# Patient Record
Sex: Female | Born: 1955 | Race: White | Hispanic: No | Smoking: Former smoker
Health system: Southern US, Community
[De-identification: ages and names within clinical notes are randomized; demographics above are authoritative.]

## PROBLEM LIST (undated history)

## (undated) ENCOUNTER — Ambulatory Visit: Source: Home / Self Care

## (undated) DIAGNOSIS — C3490 Malignant neoplasm of unspecified part of unspecified bronchus or lung: Principal | ICD-10-CM

## (undated) DIAGNOSIS — Z5189 Encounter for other specified aftercare: Secondary | ICD-10-CM

## (undated) DIAGNOSIS — F419 Anxiety disorder, unspecified: Secondary | ICD-10-CM

## (undated) DIAGNOSIS — D279 Benign neoplasm of unspecified ovary: Secondary | ICD-10-CM

## (undated) DIAGNOSIS — Z8719 Personal history of other diseases of the digestive system: Secondary | ICD-10-CM

## (undated) DIAGNOSIS — R42 Dizziness and giddiness: Secondary | ICD-10-CM

## (undated) DIAGNOSIS — J700 Acute pulmonary manifestations due to radiation: Secondary | ICD-10-CM

## (undated) DIAGNOSIS — F418 Other specified anxiety disorders: Secondary | ICD-10-CM

## (undated) DIAGNOSIS — E039 Hypothyroidism, unspecified: Secondary | ICD-10-CM

## (undated) HISTORY — DX: Hypothyroidism, unspecified: E03.9

## (undated) HISTORY — PX: ABDOMINAL HYSTERECTOMY: SHX81

## (undated) HISTORY — DX: Benign neoplasm of unspecified ovary: D27.9

## (undated) HISTORY — DX: Encounter for other specified aftercare: Z51.89

## (undated) HISTORY — DX: Other specified anxiety disorders: F41.8

## (undated) HISTORY — DX: Malignant neoplasm of unspecified part of unspecified bronchus or lung: C34.90

## (undated) HISTORY — DX: Acute pulmonary manifestations due to radiation: J70.0

## (undated) HISTORY — DX: Personal history of other diseases of the digestive system: Z87.19

## (undated) HISTORY — PX: OTHER SURGICAL HISTORY: SHX169

## (undated) HISTORY — DX: Anxiety disorder, unspecified: F41.9

---

## 1999-01-23 ENCOUNTER — Other Ambulatory Visit: Admission: RE | Admit: 1999-01-23 | Discharge: 1999-01-23 | Payer: Self-pay | Admitting: Obstetrics and Gynecology

## 1999-01-30 ENCOUNTER — Encounter: Payer: Self-pay | Admitting: Thoracic Surgery

## 1999-01-30 ENCOUNTER — Ambulatory Visit (HOSPITAL_COMMUNITY): Admission: RE | Admit: 1999-01-30 | Discharge: 1999-01-30 | Payer: Self-pay | Admitting: Thoracic Surgery

## 1999-03-31 ENCOUNTER — Encounter: Admission: RE | Admit: 1999-03-31 | Discharge: 1999-06-29 | Payer: Self-pay | Admitting: Radiation Oncology

## 1999-05-02 ENCOUNTER — Encounter: Payer: Self-pay | Admitting: Oncology

## 1999-05-02 ENCOUNTER — Encounter: Payer: Self-pay | Admitting: Emergency Medicine

## 1999-05-03 ENCOUNTER — Inpatient Hospital Stay (HOSPITAL_COMMUNITY): Admission: EM | Admit: 1999-05-03 | Discharge: 1999-05-05 | Payer: Self-pay | Admitting: Emergency Medicine

## 1999-05-05 ENCOUNTER — Encounter: Payer: Self-pay | Admitting: Oncology

## 1999-07-13 ENCOUNTER — Encounter: Payer: Self-pay | Admitting: Oncology

## 1999-07-13 ENCOUNTER — Encounter: Admission: RE | Admit: 1999-07-13 | Discharge: 1999-07-13 | Payer: Self-pay | Admitting: Oncology

## 1999-07-13 ENCOUNTER — Encounter: Admission: RE | Admit: 1999-07-13 | Discharge: 1999-10-11 | Payer: Self-pay | Admitting: Oncology

## 1999-07-13 ENCOUNTER — Emergency Department (HOSPITAL_COMMUNITY): Admission: EM | Admit: 1999-07-13 | Discharge: 1999-07-13 | Payer: Self-pay | Admitting: Emergency Medicine

## 1999-07-27 ENCOUNTER — Encounter: Admission: RE | Admit: 1999-07-27 | Discharge: 1999-07-27 | Payer: Self-pay | Admitting: Oncology

## 1999-07-27 ENCOUNTER — Encounter: Payer: Self-pay | Admitting: Oncology

## 1999-08-12 ENCOUNTER — Encounter: Payer: Self-pay | Admitting: Oncology

## 1999-08-12 ENCOUNTER — Encounter: Admission: RE | Admit: 1999-08-12 | Discharge: 1999-08-12 | Payer: Self-pay | Admitting: Oncology

## 1999-08-27 ENCOUNTER — Encounter: Payer: Self-pay | Admitting: Oncology

## 1999-08-27 ENCOUNTER — Encounter: Admission: RE | Admit: 1999-08-27 | Discharge: 1999-08-27 | Payer: Self-pay | Admitting: Oncology

## 1999-10-08 ENCOUNTER — Encounter: Payer: Self-pay | Admitting: Oncology

## 1999-10-08 ENCOUNTER — Encounter: Admission: RE | Admit: 1999-10-08 | Discharge: 1999-10-08 | Payer: Self-pay | Admitting: Oncology

## 1999-10-09 ENCOUNTER — Encounter: Payer: Self-pay | Admitting: Oncology

## 1999-10-09 ENCOUNTER — Encounter: Admission: RE | Admit: 1999-10-09 | Discharge: 1999-10-09 | Payer: Self-pay | Admitting: Oncology

## 1999-11-04 ENCOUNTER — Encounter: Admission: RE | Admit: 1999-11-04 | Discharge: 1999-11-04 | Payer: Self-pay | Admitting: Oncology

## 1999-11-04 ENCOUNTER — Encounter: Payer: Self-pay | Admitting: Oncology

## 1999-11-17 ENCOUNTER — Encounter: Admission: RE | Admit: 1999-11-17 | Discharge: 1999-11-17 | Payer: Self-pay | Admitting: Oncology

## 1999-11-17 ENCOUNTER — Encounter: Payer: Self-pay | Admitting: Oncology

## 1999-11-18 ENCOUNTER — Encounter: Admission: RE | Admit: 1999-11-18 | Discharge: 2000-02-16 | Payer: Self-pay | Admitting: Radiation Oncology

## 1999-11-25 ENCOUNTER — Encounter: Admission: RE | Admit: 1999-11-25 | Discharge: 1999-11-25 | Payer: Self-pay | Admitting: Oncology

## 1999-11-25 ENCOUNTER — Encounter: Payer: Self-pay | Admitting: Oncology

## 1999-12-09 ENCOUNTER — Ambulatory Visit (HOSPITAL_COMMUNITY): Admission: RE | Admit: 1999-12-09 | Discharge: 1999-12-09 | Payer: Self-pay | Admitting: Psychiatry

## 1999-12-22 ENCOUNTER — Ambulatory Visit (HOSPITAL_COMMUNITY): Admission: RE | Admit: 1999-12-22 | Discharge: 1999-12-22 | Payer: Self-pay | Admitting: Psychiatry

## 1999-12-28 ENCOUNTER — Ambulatory Visit (HOSPITAL_COMMUNITY): Admission: RE | Admit: 1999-12-28 | Discharge: 1999-12-28 | Payer: Self-pay | Admitting: Psychiatry

## 2000-01-04 ENCOUNTER — Ambulatory Visit (HOSPITAL_COMMUNITY): Admission: RE | Admit: 2000-01-04 | Discharge: 2000-01-04 | Payer: Self-pay | Admitting: Psychiatry

## 2000-01-18 ENCOUNTER — Ambulatory Visit (HOSPITAL_COMMUNITY): Admission: RE | Admit: 2000-01-18 | Discharge: 2000-01-18 | Payer: Self-pay | Admitting: Psychiatry

## 2000-01-25 ENCOUNTER — Encounter: Payer: Self-pay | Admitting: Oncology

## 2000-01-25 ENCOUNTER — Encounter: Admission: RE | Admit: 2000-01-25 | Discharge: 2000-01-25 | Payer: Self-pay | Admitting: Oncology

## 2000-01-27 ENCOUNTER — Encounter: Admission: RE | Admit: 2000-01-27 | Discharge: 2000-01-27 | Payer: Self-pay | Admitting: Obstetrics and Gynecology

## 2000-01-27 ENCOUNTER — Encounter: Payer: Self-pay | Admitting: Obstetrics and Gynecology

## 2000-01-28 ENCOUNTER — Ambulatory Visit (HOSPITAL_COMMUNITY): Admission: RE | Admit: 2000-01-28 | Discharge: 2000-01-28 | Payer: Self-pay | Admitting: Psychiatry

## 2000-02-10 ENCOUNTER — Other Ambulatory Visit: Admission: RE | Admit: 2000-02-10 | Discharge: 2000-02-10 | Payer: Self-pay | Admitting: Obstetrics and Gynecology

## 2000-03-31 ENCOUNTER — Encounter: Admission: RE | Admit: 2000-03-31 | Discharge: 2000-03-31 | Payer: Self-pay | Admitting: Oncology

## 2000-03-31 ENCOUNTER — Encounter: Payer: Self-pay | Admitting: Oncology

## 2000-04-11 ENCOUNTER — Ambulatory Visit (HOSPITAL_COMMUNITY): Admission: RE | Admit: 2000-04-11 | Discharge: 2000-04-11 | Payer: Self-pay | Admitting: Gastroenterology

## 2000-04-11 ENCOUNTER — Encounter (INDEPENDENT_AMBULATORY_CARE_PROVIDER_SITE_OTHER): Payer: Self-pay

## 2000-04-19 ENCOUNTER — Encounter: Payer: Self-pay | Admitting: Oncology

## 2000-04-19 ENCOUNTER — Ambulatory Visit (HOSPITAL_COMMUNITY): Admission: RE | Admit: 2000-04-19 | Discharge: 2000-04-19 | Payer: Self-pay | Admitting: Oncology

## 2000-04-19 ENCOUNTER — Encounter: Admission: RE | Admit: 2000-04-19 | Discharge: 2000-04-19 | Payer: Self-pay | Admitting: Oncology

## 2000-06-02 ENCOUNTER — Encounter: Admission: RE | Admit: 2000-06-02 | Discharge: 2000-06-02 | Payer: Self-pay | Admitting: Oncology

## 2000-06-02 ENCOUNTER — Encounter: Payer: Self-pay | Admitting: Oncology

## 2000-09-02 ENCOUNTER — Encounter: Payer: Self-pay | Admitting: Oncology

## 2000-09-02 ENCOUNTER — Encounter: Admission: RE | Admit: 2000-09-02 | Discharge: 2000-09-02 | Payer: Self-pay | Admitting: Oncology

## 2000-11-28 ENCOUNTER — Encounter: Payer: Self-pay | Admitting: Oncology

## 2000-11-28 ENCOUNTER — Encounter: Admission: RE | Admit: 2000-11-28 | Discharge: 2000-11-28 | Payer: Self-pay | Admitting: Oncology

## 2000-12-02 ENCOUNTER — Encounter: Payer: Self-pay | Admitting: Oncology

## 2000-12-02 ENCOUNTER — Ambulatory Visit (HOSPITAL_COMMUNITY): Admission: RE | Admit: 2000-12-02 | Discharge: 2000-12-02 | Payer: Self-pay | Admitting: Oncology

## 2001-01-06 ENCOUNTER — Encounter: Admission: RE | Admit: 2001-01-06 | Discharge: 2001-01-06 | Payer: Self-pay | Admitting: Oncology

## 2001-01-06 ENCOUNTER — Encounter: Payer: Self-pay | Admitting: Oncology

## 2001-01-31 ENCOUNTER — Encounter: Payer: Self-pay | Admitting: Oncology

## 2001-01-31 ENCOUNTER — Encounter: Admission: RE | Admit: 2001-01-31 | Discharge: 2001-01-31 | Payer: Self-pay | Admitting: Oncology

## 2001-02-09 ENCOUNTER — Other Ambulatory Visit: Admission: RE | Admit: 2001-02-09 | Discharge: 2001-02-09 | Payer: Self-pay | Admitting: Obstetrics and Gynecology

## 2001-04-28 ENCOUNTER — Encounter: Admission: RE | Admit: 2001-04-28 | Discharge: 2001-04-28 | Payer: Self-pay | Admitting: Oncology

## 2001-04-28 ENCOUNTER — Encounter: Payer: Self-pay | Admitting: Oncology

## 2001-06-01 ENCOUNTER — Encounter: Admission: RE | Admit: 2001-06-01 | Discharge: 2001-06-01 | Payer: Self-pay | Admitting: Oncology

## 2001-06-01 ENCOUNTER — Encounter: Payer: Self-pay | Admitting: Oncology

## 2001-08-04 ENCOUNTER — Encounter: Admission: RE | Admit: 2001-08-04 | Discharge: 2001-08-04 | Payer: Self-pay | Admitting: Oncology

## 2001-08-04 ENCOUNTER — Encounter: Payer: Self-pay | Admitting: Oncology

## 2001-10-26 ENCOUNTER — Encounter: Payer: Self-pay | Admitting: Oncology

## 2001-10-26 ENCOUNTER — Encounter: Admission: RE | Admit: 2001-10-26 | Discharge: 2001-10-26 | Payer: Self-pay | Admitting: Oncology

## 2001-10-30 ENCOUNTER — Encounter (INDEPENDENT_AMBULATORY_CARE_PROVIDER_SITE_OTHER): Payer: Self-pay | Admitting: *Deleted

## 2001-10-30 ENCOUNTER — Ambulatory Visit (HOSPITAL_COMMUNITY): Admission: RE | Admit: 2001-10-30 | Discharge: 2001-10-30 | Payer: Self-pay | Admitting: Internal Medicine

## 2001-10-30 ENCOUNTER — Encounter: Payer: Self-pay | Admitting: Internal Medicine

## 2002-02-06 ENCOUNTER — Encounter: Payer: Self-pay | Admitting: Oncology

## 2002-02-06 ENCOUNTER — Encounter: Admission: RE | Admit: 2002-02-06 | Discharge: 2002-02-06 | Payer: Self-pay | Admitting: Oncology

## 2002-02-16 ENCOUNTER — Other Ambulatory Visit: Admission: RE | Admit: 2002-02-16 | Discharge: 2002-02-16 | Payer: Self-pay | Admitting: Obstetrics and Gynecology

## 2002-03-03 ENCOUNTER — Encounter: Payer: Self-pay | Admitting: Emergency Medicine

## 2002-03-03 ENCOUNTER — Emergency Department (HOSPITAL_COMMUNITY): Admission: EM | Admit: 2002-03-03 | Discharge: 2002-03-03 | Payer: Self-pay | Admitting: Emergency Medicine

## 2002-05-03 ENCOUNTER — Encounter: Admission: RE | Admit: 2002-05-03 | Discharge: 2002-05-03 | Payer: Self-pay | Admitting: Oncology

## 2002-05-03 ENCOUNTER — Encounter: Payer: Self-pay | Admitting: Oncology

## 2002-08-06 ENCOUNTER — Encounter: Admission: RE | Admit: 2002-08-06 | Discharge: 2002-08-06 | Payer: Self-pay | Admitting: Oncology

## 2002-08-06 ENCOUNTER — Encounter: Payer: Self-pay | Admitting: Oncology

## 2002-10-22 ENCOUNTER — Encounter: Admission: RE | Admit: 2002-10-22 | Discharge: 2002-10-22 | Payer: Self-pay | Admitting: Oncology

## 2002-10-22 ENCOUNTER — Encounter: Payer: Self-pay | Admitting: Oncology

## 2002-12-14 ENCOUNTER — Encounter: Admission: RE | Admit: 2002-12-14 | Discharge: 2002-12-14 | Payer: Self-pay | Admitting: Family Medicine

## 2002-12-14 ENCOUNTER — Encounter: Payer: Self-pay | Admitting: Family Medicine

## 2003-01-18 ENCOUNTER — Other Ambulatory Visit: Admission: RE | Admit: 2003-01-18 | Discharge: 2003-01-18 | Payer: Self-pay | Admitting: *Deleted

## 2003-02-12 ENCOUNTER — Encounter: Payer: Self-pay | Admitting: Oncology

## 2003-02-12 ENCOUNTER — Encounter: Admission: RE | Admit: 2003-02-12 | Discharge: 2003-02-12 | Payer: Self-pay | Admitting: Oncology

## 2003-03-11 ENCOUNTER — Other Ambulatory Visit: Admission: RE | Admit: 2003-03-11 | Discharge: 2003-03-11 | Payer: Self-pay | Admitting: Obstetrics and Gynecology

## 2003-04-24 ENCOUNTER — Encounter (INDEPENDENT_AMBULATORY_CARE_PROVIDER_SITE_OTHER): Payer: Self-pay | Admitting: *Deleted

## 2003-04-24 ENCOUNTER — Encounter: Payer: Self-pay | Admitting: Obstetrics and Gynecology

## 2003-04-24 ENCOUNTER — Ambulatory Visit (HOSPITAL_COMMUNITY): Admission: RE | Admit: 2003-04-24 | Discharge: 2003-04-24 | Payer: Self-pay | Admitting: Obstetrics and Gynecology

## 2003-05-22 ENCOUNTER — Encounter: Admission: RE | Admit: 2003-05-22 | Discharge: 2003-05-22 | Payer: Self-pay | Admitting: Oncology

## 2003-05-22 ENCOUNTER — Encounter: Payer: Self-pay | Admitting: Oncology

## 2003-08-29 ENCOUNTER — Encounter: Admission: RE | Admit: 2003-08-29 | Discharge: 2003-08-29 | Payer: Self-pay | Admitting: Oncology

## 2003-10-08 ENCOUNTER — Ambulatory Visit (HOSPITAL_COMMUNITY): Admission: RE | Admit: 2003-10-08 | Discharge: 2003-10-08 | Payer: Self-pay | Admitting: Oncology

## 2003-10-31 ENCOUNTER — Encounter: Admission: RE | Admit: 2003-10-31 | Discharge: 2003-10-31 | Payer: Self-pay | Admitting: Oncology

## 2003-11-14 ENCOUNTER — Other Ambulatory Visit (HOSPITAL_COMMUNITY): Admission: RE | Admit: 2003-11-14 | Discharge: 2003-11-29 | Payer: Self-pay | Admitting: Psychiatry

## 2003-12-31 ENCOUNTER — Other Ambulatory Visit: Admission: RE | Admit: 2003-12-31 | Discharge: 2003-12-31 | Payer: Self-pay | Admitting: Obstetrics and Gynecology

## 2004-01-09 ENCOUNTER — Encounter: Admission: RE | Admit: 2004-01-09 | Discharge: 2004-01-09 | Payer: Self-pay | Admitting: Obstetrics and Gynecology

## 2004-02-25 ENCOUNTER — Encounter: Admission: RE | Admit: 2004-02-25 | Discharge: 2004-02-25 | Payer: Self-pay | Admitting: Oncology

## 2004-06-23 ENCOUNTER — Ambulatory Visit (HOSPITAL_COMMUNITY): Admission: RE | Admit: 2004-06-23 | Discharge: 2004-06-23 | Payer: Self-pay | Admitting: Oncology

## 2004-10-19 ENCOUNTER — Ambulatory Visit: Payer: Self-pay | Admitting: Oncology

## 2004-10-21 ENCOUNTER — Encounter: Admission: RE | Admit: 2004-10-21 | Discharge: 2004-10-21 | Payer: Self-pay | Admitting: Oncology

## 2005-02-23 ENCOUNTER — Encounter: Admission: RE | Admit: 2005-02-23 | Discharge: 2005-02-23 | Payer: Self-pay | Admitting: Obstetrics and Gynecology

## 2005-04-19 ENCOUNTER — Ambulatory Visit: Payer: Self-pay | Admitting: Oncology

## 2005-04-22 ENCOUNTER — Encounter: Admission: RE | Admit: 2005-04-22 | Discharge: 2005-04-22 | Payer: Self-pay | Admitting: Oncology

## 2005-06-01 ENCOUNTER — Ambulatory Visit: Admission: RE | Admit: 2005-06-01 | Discharge: 2005-06-01 | Payer: Self-pay | Admitting: Gynecologic Oncology

## 2005-07-02 ENCOUNTER — Ambulatory Visit: Payer: Self-pay | Admitting: Oncology

## 2005-07-06 ENCOUNTER — Ambulatory Visit (HOSPITAL_COMMUNITY): Admission: RE | Admit: 2005-07-06 | Discharge: 2005-07-06 | Payer: Self-pay | Admitting: Gynecologic Oncology

## 2005-07-06 ENCOUNTER — Encounter (INDEPENDENT_AMBULATORY_CARE_PROVIDER_SITE_OTHER): Payer: Self-pay | Admitting: Specialist

## 2005-08-17 ENCOUNTER — Encounter: Admission: RE | Admit: 2005-08-17 | Discharge: 2005-11-15 | Payer: Self-pay | Admitting: Psychiatry

## 2005-08-17 ENCOUNTER — Ambulatory Visit: Payer: Self-pay | Admitting: Psychology

## 2005-10-06 ENCOUNTER — Ambulatory Visit: Admission: RE | Admit: 2005-10-06 | Discharge: 2005-10-06 | Payer: Self-pay | Admitting: Gynecologic Oncology

## 2005-10-21 ENCOUNTER — Ambulatory Visit: Payer: Self-pay | Admitting: Oncology

## 2005-12-10 ENCOUNTER — Ambulatory Visit: Payer: Self-pay | Admitting: Oncology

## 2005-12-13 ENCOUNTER — Ambulatory Visit (HOSPITAL_COMMUNITY): Admission: RE | Admit: 2005-12-13 | Discharge: 2005-12-13 | Payer: Self-pay | Admitting: Oncology

## 2005-12-17 ENCOUNTER — Ambulatory Visit: Payer: Self-pay | Admitting: Oncology

## 2006-06-08 ENCOUNTER — Ambulatory Visit: Payer: Self-pay | Admitting: Oncology

## 2006-06-10 ENCOUNTER — Ambulatory Visit (HOSPITAL_COMMUNITY): Admission: RE | Admit: 2006-06-10 | Discharge: 2006-06-10 | Payer: Self-pay | Admitting: Oncology

## 2006-06-10 LAB — CBC WITH DIFFERENTIAL/PLATELET
BASO%: 0.1 % (ref 0.0–2.0)
EOS%: 2 % (ref 0.0–7.0)
Eosinophils Absolute: 0.1 10*3/uL (ref 0.0–0.5)
LYMPH%: 18.1 % (ref 14.0–48.0)
MCH: 31.3 pg (ref 26.0–34.0)
MCHC: 34.4 g/dL (ref 32.0–36.0)
MCV: 90.9 fL (ref 81.0–101.0)
MONO%: 6.5 % (ref 0.0–13.0)
NEUT#: 3.8 10*3/uL (ref 1.5–6.5)
RBC: 4.11 10*6/uL (ref 3.70–5.32)
RDW: 13.5 % (ref 11.3–14.5)

## 2006-06-10 LAB — COMPREHENSIVE METABOLIC PANEL
ALT: 12 U/L (ref 0–40)
AST: 13 U/L (ref 0–37)
Albumin: 4.7 g/dL (ref 3.5–5.2)
Alkaline Phosphatase: 61 U/L (ref 39–117)
Potassium: 4.4 mEq/L (ref 3.5–5.3)
Sodium: 137 mEq/L (ref 135–145)
Total Bilirubin: 0.4 mg/dL (ref 0.3–1.2)
Total Protein: 7.6 g/dL (ref 6.0–8.3)

## 2006-09-06 ENCOUNTER — Ambulatory Visit: Payer: Self-pay | Admitting: Psychiatry

## 2006-09-15 ENCOUNTER — Ambulatory Visit: Payer: Self-pay | Admitting: Psychiatry

## 2006-09-26 ENCOUNTER — Emergency Department (HOSPITAL_COMMUNITY): Admission: EM | Admit: 2006-09-26 | Discharge: 2006-09-26 | Payer: Self-pay | Admitting: Emergency Medicine

## 2006-10-12 ENCOUNTER — Ambulatory Visit: Payer: Self-pay | Admitting: Psychiatry

## 2006-10-19 ENCOUNTER — Ambulatory Visit: Payer: Self-pay | Admitting: Psychiatry

## 2006-12-06 ENCOUNTER — Ambulatory Visit: Payer: Self-pay | Admitting: Oncology

## 2006-12-06 ENCOUNTER — Ambulatory Visit (HOSPITAL_COMMUNITY): Admission: RE | Admit: 2006-12-06 | Discharge: 2006-12-06 | Payer: Self-pay | Admitting: Oncology

## 2006-12-07 LAB — COMPREHENSIVE METABOLIC PANEL
Albumin: 4.4 g/dL (ref 3.5–5.2)
Alkaline Phosphatase: 66 U/L (ref 39–117)
BUN: 16 mg/dL (ref 6–23)
CO2: 26 mEq/L (ref 19–32)
Glucose, Bld: 103 mg/dL — ABNORMAL HIGH (ref 70–99)
Potassium: 4.5 mEq/L (ref 3.5–5.3)
Total Bilirubin: 0.3 mg/dL (ref 0.3–1.2)
Total Protein: 7.7 g/dL (ref 6.0–8.3)

## 2006-12-07 LAB — CBC WITH DIFFERENTIAL/PLATELET
Basophils Absolute: 0 10*3/uL (ref 0.0–0.1)
Eosinophils Absolute: 0.2 10*3/uL (ref 0.0–0.5)
HGB: 13 g/dL (ref 11.6–15.9)
LYMPH%: 18.9 % (ref 14.0–48.0)
MCV: 88.2 fL (ref 81.0–101.0)
MONO#: 0.4 10*3/uL (ref 0.1–0.9)
MONO%: 7.8 % (ref 0.0–13.0)
NEUT#: 3.4 10*3/uL (ref 1.5–6.5)
Platelets: 374 10*3/uL (ref 145–400)
WBC: 5 10*3/uL (ref 3.9–10.0)

## 2006-12-07 LAB — LACTATE DEHYDROGENASE: LDH: 120 U/L (ref 94–250)

## 2006-12-09 ENCOUNTER — Ambulatory Visit (HOSPITAL_COMMUNITY): Admission: RE | Admit: 2006-12-09 | Discharge: 2006-12-09 | Payer: Self-pay | Admitting: Oncology

## 2007-01-31 ENCOUNTER — Ambulatory Visit: Payer: Self-pay | Admitting: Oncology

## 2007-02-02 LAB — TSH: TSH: 1.831 u[IU]/mL (ref 0.350–5.500)

## 2007-05-29 ENCOUNTER — Ambulatory Visit: Payer: Self-pay | Admitting: Oncology

## 2007-05-31 ENCOUNTER — Ambulatory Visit (HOSPITAL_COMMUNITY): Admission: RE | Admit: 2007-05-31 | Discharge: 2007-05-31 | Payer: Self-pay | Admitting: Oncology

## 2007-05-31 LAB — CBC WITH DIFFERENTIAL/PLATELET
Basophils Absolute: 0.1 10*3/uL (ref 0.0–0.1)
EOS%: 3 % (ref 0.0–7.0)
HGB: 12.5 g/dL (ref 11.6–15.9)
MCH: 30.8 pg (ref 26.0–34.0)
MCV: 88.7 fL (ref 81.0–101.0)
MONO%: 8.6 % (ref 0.0–13.0)
NEUT%: 66.1 % (ref 39.6–76.8)
RDW: 14.9 % — ABNORMAL HIGH (ref 11.3–14.5)

## 2007-05-31 LAB — COMPREHENSIVE METABOLIC PANEL
AST: 25 U/L (ref 0–37)
Alkaline Phosphatase: 64 U/L (ref 39–117)
BUN: 12 mg/dL (ref 6–23)
Creatinine, Ser: 0.69 mg/dL (ref 0.40–1.20)
Potassium: 4.8 mEq/L (ref 3.5–5.3)

## 2007-09-01 ENCOUNTER — Emergency Department (HOSPITAL_COMMUNITY): Admission: EM | Admit: 2007-09-01 | Discharge: 2007-09-02 | Payer: Self-pay | Admitting: Emergency Medicine

## 2007-11-23 ENCOUNTER — Ambulatory Visit: Payer: Self-pay | Admitting: Oncology

## 2007-11-27 ENCOUNTER — Ambulatory Visit (HOSPITAL_COMMUNITY): Admission: RE | Admit: 2007-11-27 | Discharge: 2007-11-27 | Payer: Self-pay | Admitting: Oncology

## 2007-11-27 LAB — COMPREHENSIVE METABOLIC PANEL
AST: 18 U/L (ref 0–37)
Albumin: 4 g/dL (ref 3.5–5.2)
BUN: 12 mg/dL (ref 6–23)
Calcium: 9.2 mg/dL (ref 8.4–10.5)
Chloride: 102 mEq/L (ref 96–112)
Potassium: 3.5 mEq/L (ref 3.5–5.3)
Sodium: 138 mEq/L (ref 135–145)
Total Protein: 7.3 g/dL (ref 6.0–8.3)

## 2007-11-27 LAB — CBC WITH DIFFERENTIAL/PLATELET
Basophils Absolute: 0 10*3/uL (ref 0.0–0.1)
EOS%: 4.8 % (ref 0.0–7.0)
Eosinophils Absolute: 0.3 10*3/uL (ref 0.0–0.5)
HGB: 12.8 g/dL (ref 11.6–15.9)
NEUT#: 4 10*3/uL (ref 1.5–6.5)
RDW: 14 % (ref 11.3–14.5)
lymph#: 1.6 10*3/uL (ref 0.9–3.3)

## 2008-01-27 IMAGING — CT CT CHEST W/ CM
2 of 4 series · 17 of 46 positions shown, 19 images · non-contrast
Comparison: none

CLINICAL DATA: Lung carcinoma status post chemotherapy and radiation.

[Series 2: cap 5.0 b40f · axial · 0.71mm/px · z∈[-664,-44]mm · 14 of 136 slices shown, 16 images]
[im 6/136  soft-tissue]
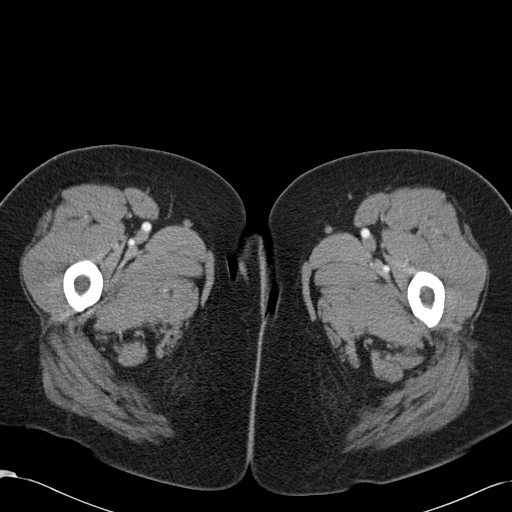
[im 6/136  bone]
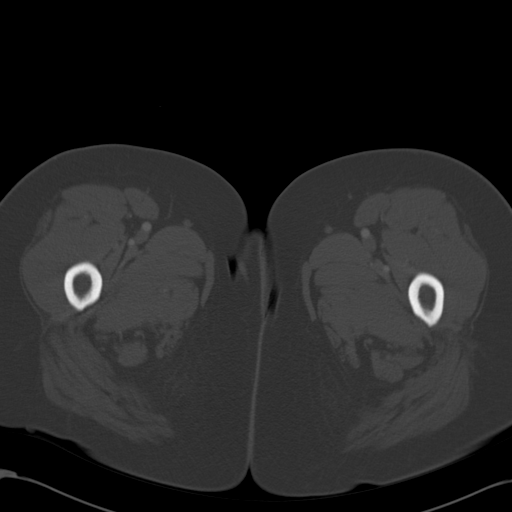
[im 17/136  soft-tissue]
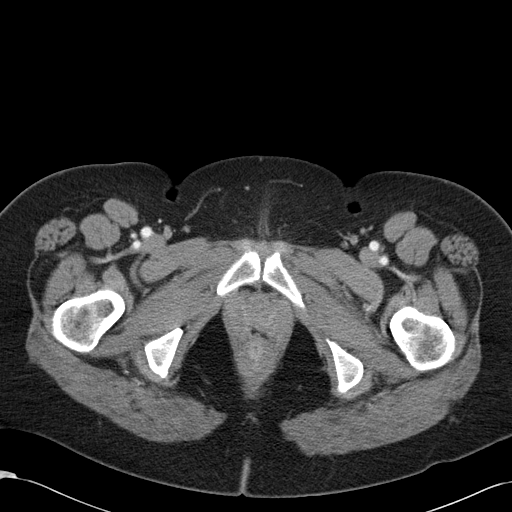
[im 29/136  soft-tissue]
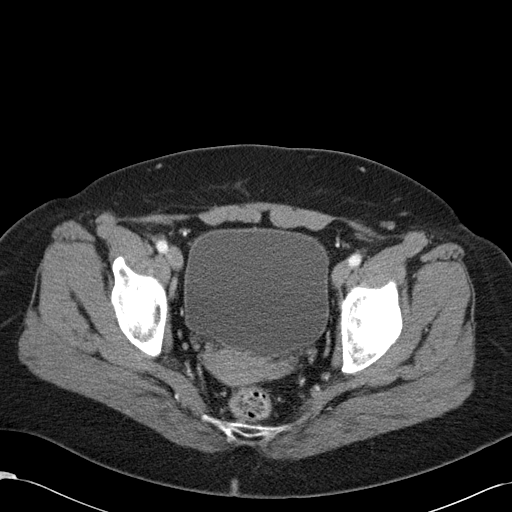
[im 34/136  soft-tissue]
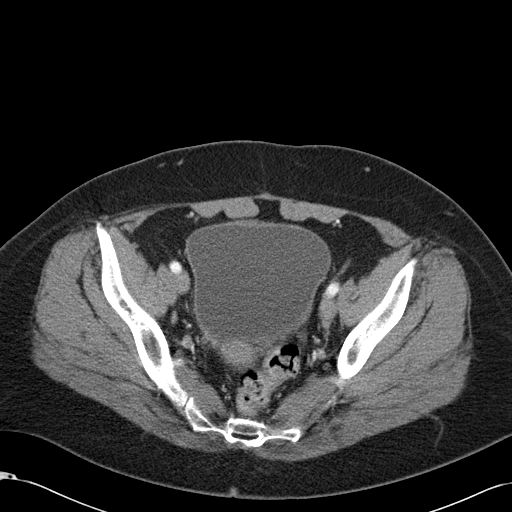
[im 46/136  soft-tissue]
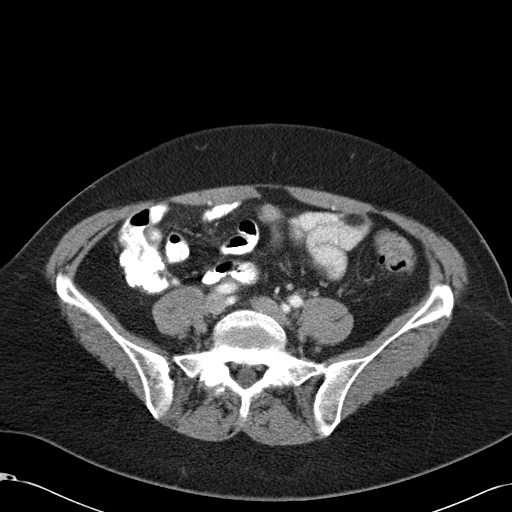
[im 57/136  soft-tissue]
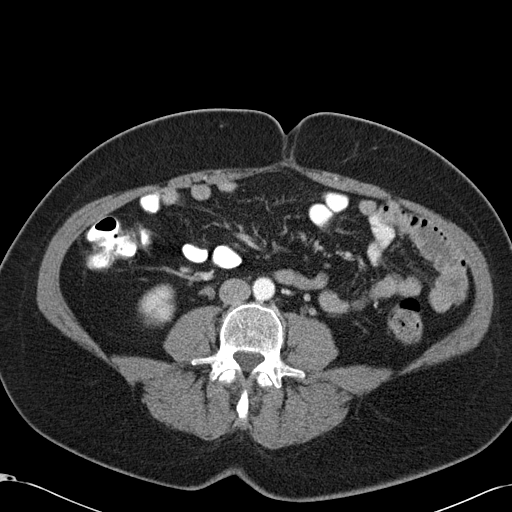
[im 62/136  soft-tissue]
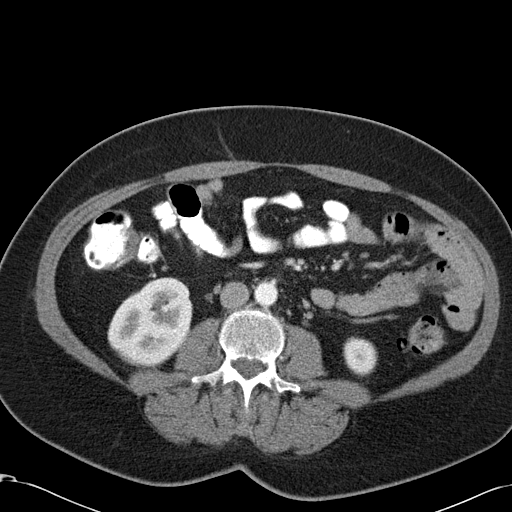
[im 74/136  soft-tissue]
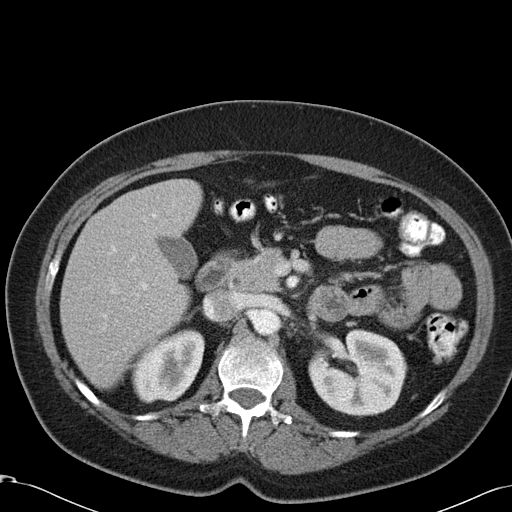
[im 79/136  soft-tissue]
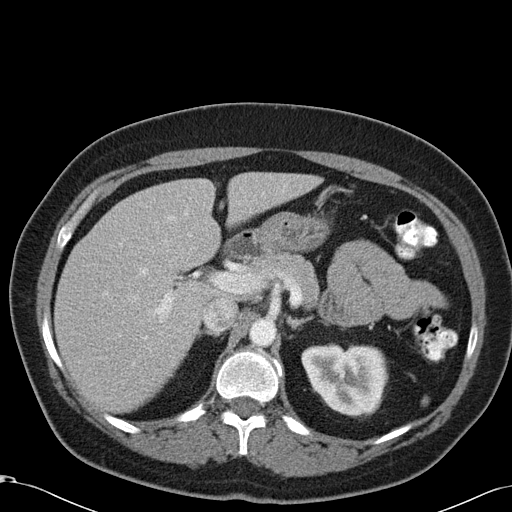
[im 79/136  bone]
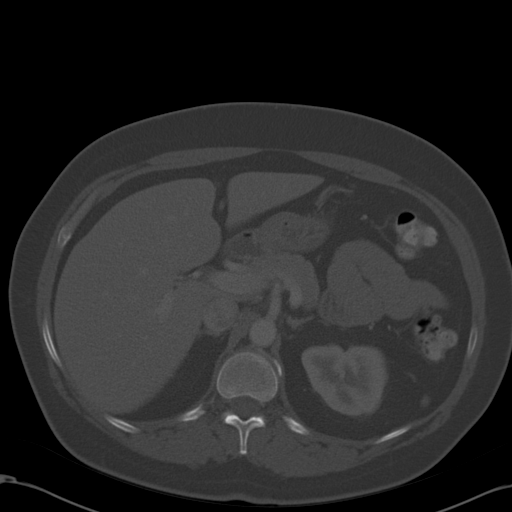
[im 91/136  soft-tissue]
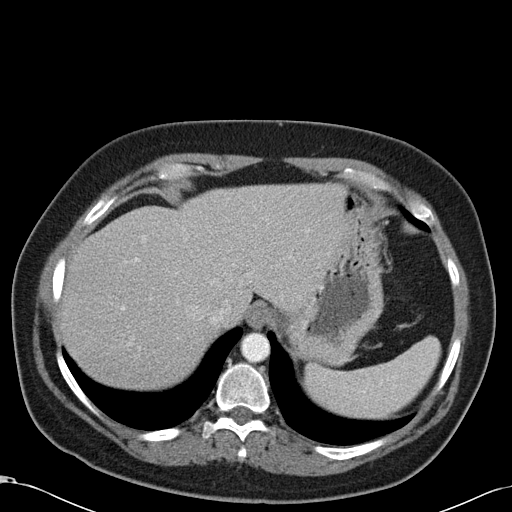
[im 102/136  soft-tissue]
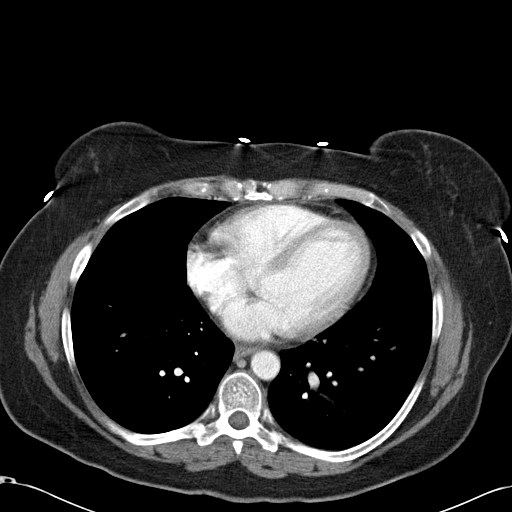
[im 107/136  soft-tissue]
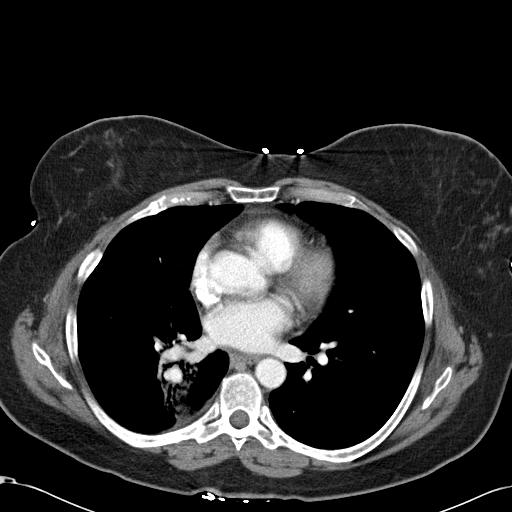
[im 119/136  soft-tissue]
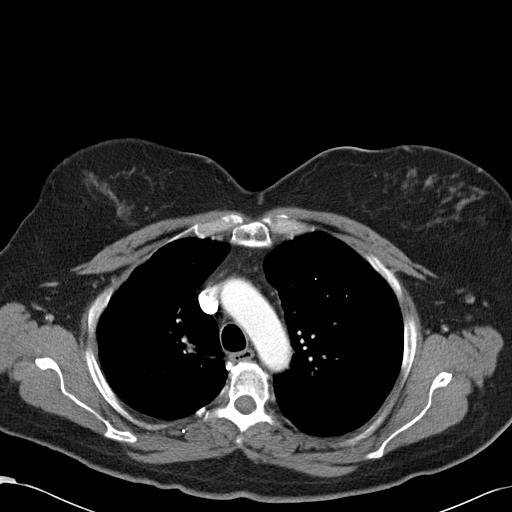
[im 130/136  soft-tissue]
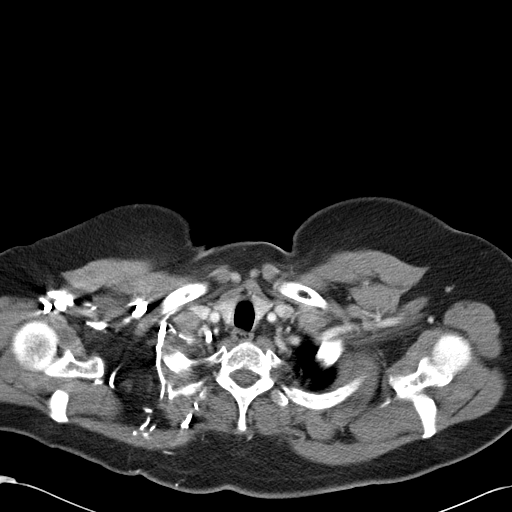

[Series 602: <mpr thick range> · coronal · 1.32mm/px · 3 of 83 slices shown]
[im 28/83  soft-tissue]
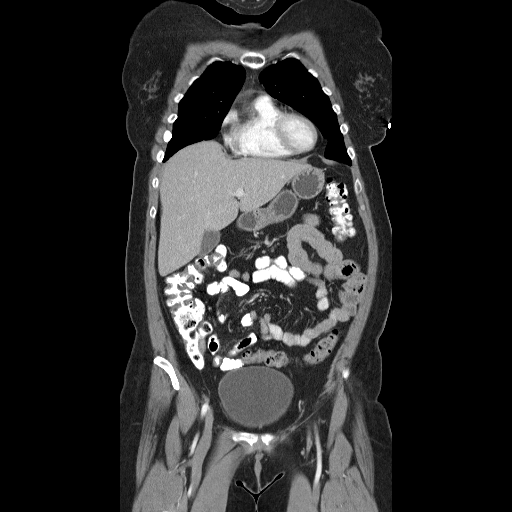
[im 37/83  soft-tissue]
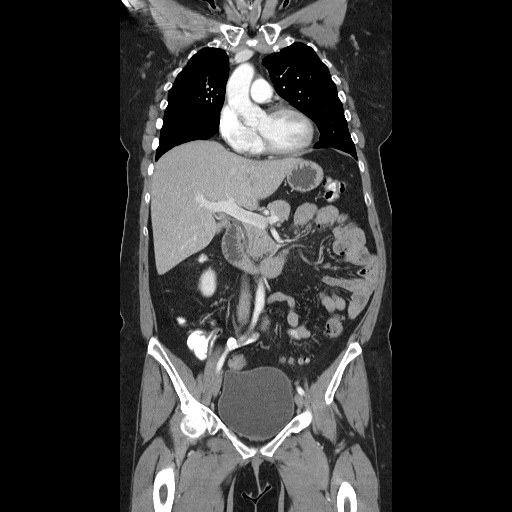
[im 46/83  soft-tissue]
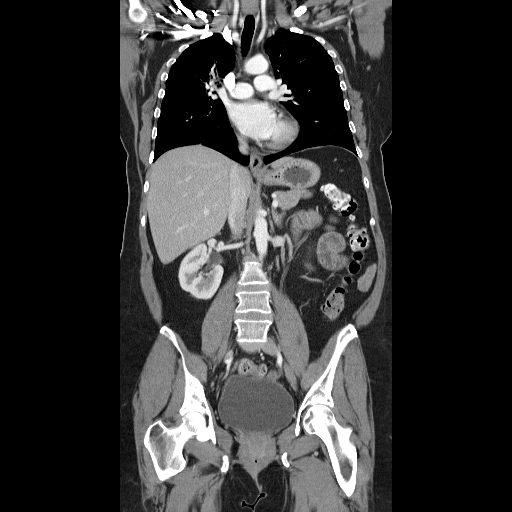

[17 of 46 positions shown; findings below may reference images not displayed]

CT chest with contrast:

Multidetector helical CT after 125  ml 4mnipaque-COO IV.
Comparison 06/10/2006. Stable right perihilar   parenchymal fibrosis and traction
bronchiectasis presumably related to previous radiation therapy. No hilar or
mediastinal adenopathy. Coronary calcifications are evident. No pleural or
pericardial effusion. Left lung remains clear. Vascular clip in right  
supraclavicular region. Healing fractures of the anterolateral aspect right
fifth through seventh ribs. No other bone lesions evident.
IMPRESSION: 1. Stable   right perihilar parenchymal changes without new mass, nodule, or
adenopathy.
2. Coronary calcifications.

CT abdomen with contrast:

Unremarkable adrenal glands, liver, gallbladder, spleen and accessory splenule,
kidneys, pancreas, nondilated aorta. The cystic right renal lesion is nearly
completely resolved. Small bowel decompressed. Portal vein patent. No free air.
No ascites. Stable L1 compression deformity.
IMPRESSION: 1. Negative for mass or adenopathy.
2. Stable L1 compression deformity.

CT pelvis with contrast:

Normal appendix. The colon is nondilated with a few scattered diverticula from
descending and sigmoid portions. Urinary bladder physiologically distended. No
free fluid. No adenopathy.
IMPRESSION: 1. Negative for mass or adenopathy.
2. Descending and sigmoid diverticula.

## 2008-04-01 ENCOUNTER — Ambulatory Visit: Payer: Self-pay | Admitting: Internal Medicine

## 2008-04-01 ENCOUNTER — Encounter (INDEPENDENT_AMBULATORY_CARE_PROVIDER_SITE_OTHER): Payer: Self-pay | Admitting: Family Medicine

## 2008-04-01 LAB — CONVERTED CEMR LAB: TSH: 3.045 microintl units/mL (ref 0.350–4.50)

## 2008-04-25 ENCOUNTER — Ambulatory Visit: Payer: Self-pay | Admitting: Family Medicine

## 2008-05-07 ENCOUNTER — Ambulatory Visit (HOSPITAL_COMMUNITY): Admission: RE | Admit: 2008-05-07 | Discharge: 2008-05-07 | Payer: Self-pay | Admitting: Family Medicine

## 2008-05-08 ENCOUNTER — Ambulatory Visit (HOSPITAL_COMMUNITY): Admission: RE | Admit: 2008-05-08 | Discharge: 2008-05-08 | Payer: Self-pay | Admitting: Family Medicine

## 2008-05-14 ENCOUNTER — Ambulatory Visit: Payer: Self-pay | Admitting: Family Medicine

## 2008-05-14 LAB — CONVERTED CEMR LAB
Albumin: 4.5 g/dL (ref 3.5–5.2)
Alkaline Phosphatase: 60 units/L (ref 39–117)
BUN: 13 mg/dL (ref 6–23)
Calcium: 10 mg/dL (ref 8.4–10.5)
Chloride: 104 meq/L (ref 96–112)
Eosinophils Absolute: 0.3 10*3/uL (ref 0.0–0.7)
Glucose, Bld: 94 mg/dL (ref 70–99)
HDL: 65 mg/dL (ref >39)
Hemoglobin: 12.6 g/dL (ref 12.0–15.0)
Lymphs Abs: 1.3 10*3/uL (ref 0.7–4.0)
MCV: 93.6 fL (ref 78.0–100.0)
Monocytes Absolute: 0.4 10*3/uL (ref 0.1–1.0)
Monocytes Relative: 6 % (ref 3–12)
Neutrophils Relative %: 67 % (ref 43–77)
Potassium: 4.3 meq/L (ref 3.5–5.3)
RBC: 4.25 M/uL (ref 3.87–5.11)
Triglycerides: 169 mg/dL — ABNORMAL HIGH (ref <150)
WBC: 6.1 10*3/uL (ref 4.0–10.5)

## 2008-06-07 ENCOUNTER — Ambulatory Visit: Payer: Self-pay | Admitting: Oncology

## 2008-06-11 ENCOUNTER — Ambulatory Visit (HOSPITAL_COMMUNITY): Admission: RE | Admit: 2008-06-11 | Discharge: 2008-06-11 | Payer: Self-pay | Admitting: Oncology

## 2008-06-11 LAB — CBC WITH DIFFERENTIAL/PLATELET
BASO%: 0.3 % (ref 0.0–2.0)
Basophils Absolute: 0 10*3/uL (ref 0.0–0.1)
Eosinophils Absolute: 0.3 10*3/uL (ref 0.0–0.5)
HCT: 37.4 % (ref 34.8–46.6)
HGB: 12.8 g/dL (ref 11.6–15.9)
LYMPH%: 22.8 % (ref 14.0–48.0)
MCHC: 34.2 g/dL (ref 32.0–36.0)
MONO#: 0.4 10*3/uL (ref 0.1–0.9)
NEUT%: 61.6 % (ref 39.6–76.8)
Platelets: 318 10*3/uL (ref 145–400)
WBC: 4.2 10*3/uL (ref 3.9–10.0)
lymph#: 1 10*3/uL (ref 0.9–3.3)

## 2008-06-11 LAB — COMPREHENSIVE METABOLIC PANEL
ALT: 32 U/L (ref 0–35)
BUN: 14 mg/dL (ref 6–23)
CO2: 24 mEq/L (ref 19–32)
Calcium: 9.2 mg/dL (ref 8.4–10.5)
Chloride: 101 mEq/L (ref 96–112)
Creatinine, Ser: 0.67 mg/dL (ref 0.40–1.20)
Glucose, Bld: 98 mg/dL (ref 70–99)
Total Bilirubin: 0.2 mg/dL — ABNORMAL LOW (ref 0.3–1.2)

## 2008-06-11 LAB — LACTATE DEHYDROGENASE: LDH: 159 U/L (ref 94–250)

## 2008-06-20 ENCOUNTER — Ambulatory Visit (HOSPITAL_COMMUNITY): Admission: RE | Admit: 2008-06-20 | Discharge: 2008-06-20 | Payer: Self-pay | Admitting: Gastroenterology

## 2008-09-12 ENCOUNTER — Emergency Department (HOSPITAL_BASED_OUTPATIENT_CLINIC_OR_DEPARTMENT_OTHER): Admission: EM | Admit: 2008-09-12 | Discharge: 2008-09-12 | Payer: Self-pay | Admitting: Emergency Medicine

## 2008-09-23 ENCOUNTER — Ambulatory Visit: Payer: Self-pay | Admitting: Family Medicine

## 2008-09-23 LAB — CONVERTED CEMR LAB: TSH: 2.027 u[IU]/mL

## 2008-10-11 ENCOUNTER — Ambulatory Visit: Payer: Self-pay | Admitting: Family Medicine

## 2008-10-17 ENCOUNTER — Ambulatory Visit: Payer: Self-pay | Admitting: *Deleted

## 2008-10-17 ENCOUNTER — Ambulatory Visit (HOSPITAL_COMMUNITY): Admission: RE | Admit: 2008-10-17 | Discharge: 2008-10-17 | Payer: Self-pay | Admitting: Family Medicine

## 2008-12-05 ENCOUNTER — Encounter (INDEPENDENT_AMBULATORY_CARE_PROVIDER_SITE_OTHER): Payer: Self-pay | Admitting: Family Medicine

## 2008-12-05 ENCOUNTER — Ambulatory Visit: Payer: Self-pay | Admitting: Family Medicine

## 2008-12-05 ENCOUNTER — Ambulatory Visit: Payer: Self-pay | Admitting: Oncology

## 2008-12-09 ENCOUNTER — Ambulatory Visit (HOSPITAL_COMMUNITY): Admission: RE | Admit: 2008-12-09 | Discharge: 2008-12-09 | Payer: Self-pay | Admitting: Oncology

## 2008-12-09 ENCOUNTER — Ambulatory Visit (HOSPITAL_COMMUNITY): Admission: RE | Admit: 2008-12-09 | Discharge: 2008-12-09 | Payer: Self-pay | Admitting: Family Medicine

## 2008-12-09 LAB — COMPREHENSIVE METABOLIC PANEL
Alkaline Phosphatase: 64 U/L (ref 39–117)
CO2: 27 mEq/L (ref 19–32)
Creatinine, Ser: 0.6 mg/dL (ref 0.40–1.20)
Glucose, Bld: 92 mg/dL (ref 70–99)
Sodium: 136 mEq/L (ref 135–145)
Total Bilirubin: 0.5 mg/dL (ref 0.3–1.2)
Total Protein: 8 g/dL (ref 6.0–8.3)

## 2008-12-09 LAB — CBC WITH DIFFERENTIAL/PLATELET
BASO%: 0.2 % (ref 0.0–2.0)
EOS%: 3.1 % (ref 0.0–7.0)
LYMPH%: 21.9 % (ref 14.0–49.7)
MCH: 30.4 pg (ref 25.1–34.0)
MCHC: 34.1 g/dL (ref 31.5–36.0)
MONO#: 0.5 10*3/uL (ref 0.1–0.9)
RBC: 4.26 10*6/uL (ref 3.70–5.45)
WBC: 7.1 10*3/uL (ref 3.9–10.3)
lymph#: 1.6 10*3/uL (ref 0.9–3.3)

## 2008-12-09 LAB — MORPHOLOGY
PLT EST: ADEQUATE
RBC Comments: NORMAL

## 2008-12-09 LAB — LACTATE DEHYDROGENASE: LDH: 116 U/L (ref 94–250)

## 2009-02-18 ENCOUNTER — Ambulatory Visit: Payer: Self-pay | Admitting: Internal Medicine

## 2009-09-16 ENCOUNTER — Ambulatory Visit: Payer: Self-pay | Admitting: Internal Medicine

## 2009-09-18 ENCOUNTER — Ambulatory Visit: Payer: Self-pay | Admitting: Internal Medicine

## 2009-11-04 ENCOUNTER — Ambulatory Visit: Payer: Self-pay | Admitting: Family Medicine

## 2009-11-04 LAB — CONVERTED CEMR LAB
Albumin: 4.5 g/dL (ref 3.5–5.2)
BUN: 19 mg/dL (ref 6–23)
CO2: 22 meq/L (ref 19–32)
Calcium: 9.5 mg/dL (ref 8.4–10.5)
Chloride: 101 meq/L (ref 96–112)
Cholesterol: 171 mg/dL (ref 0–200)
Glucose, Bld: 91 mg/dL (ref 70–99)
HDL: 48 mg/dL (ref >39)
Potassium: 4.1 meq/L (ref 3.5–5.3)
Sodium: 139 meq/L (ref 135–145)
Triglycerides: 142 mg/dL (ref <150)

## 2009-12-12 ENCOUNTER — Ambulatory Visit: Payer: Self-pay | Admitting: Oncology

## 2009-12-16 ENCOUNTER — Ambulatory Visit (HOSPITAL_COMMUNITY): Admission: RE | Admit: 2009-12-16 | Discharge: 2009-12-16 | Payer: Self-pay | Admitting: Oncology

## 2009-12-16 LAB — CBC WITH DIFFERENTIAL/PLATELET
Basophils Absolute: 0 10*3/uL (ref 0.0–0.1)
Eosinophils Absolute: 0.4 10*3/uL (ref 0.0–0.5)
HGB: 12.9 g/dL (ref 11.6–15.9)
LYMPH%: 21.3 % (ref 14.0–49.7)
MCV: 89.2 fL (ref 79.5–101.0)
MONO%: 7.1 % (ref 0.0–14.0)
NEUT#: 5.5 10*3/uL (ref 1.5–6.5)
Platelets: 360 10*3/uL (ref 145–400)
RDW: 13.7 % (ref 11.2–14.5)

## 2009-12-16 LAB — LACTATE DEHYDROGENASE: LDH: 127 U/L (ref 94–250)

## 2009-12-16 LAB — COMPREHENSIVE METABOLIC PANEL
Albumin: 4.2 g/dL (ref 3.5–5.2)
Alkaline Phosphatase: 65 U/L (ref 39–117)
BUN: 23 mg/dL (ref 6–23)
Calcium: 9.8 mg/dL (ref 8.4–10.5)
Glucose, Bld: 76 mg/dL (ref 70–99)
Potassium: 3.7 mEq/L (ref 3.5–5.3)

## 2009-12-22 ENCOUNTER — Encounter: Payer: Self-pay | Admitting: Internal Medicine

## 2010-07-14 ENCOUNTER — Ambulatory Visit (HOSPITAL_COMMUNITY): Admission: RE | Admit: 2010-07-14 | Discharge: 2010-07-14 | Payer: Self-pay | Admitting: Internal Medicine

## 2010-07-29 ENCOUNTER — Encounter: Admission: RE | Admit: 2010-07-29 | Discharge: 2010-07-29 | Payer: Self-pay | Admitting: Internal Medicine

## 2010-10-11 ENCOUNTER — Encounter: Payer: Self-pay | Admitting: Oncology

## 2010-10-12 ENCOUNTER — Encounter: Payer: Self-pay | Admitting: Family Medicine

## 2010-10-22 NOTE — Letter (Signed)
[facility]   Imported By: Lennie Odor 01/07/2010 13:51:53  _____________________________________________________________________  External Attachment:    Type:   Image     Comment:   External Document

## 2010-12-15 ENCOUNTER — Other Ambulatory Visit (HOSPITAL_COMMUNITY): Payer: Self-pay | Admitting: Family Medicine

## 2010-12-15 ENCOUNTER — Ambulatory Visit (HOSPITAL_COMMUNITY)
Admission: RE | Admit: 2010-12-15 | Discharge: 2010-12-15 | Disposition: A | Discharge status: Home / Self Care | Payer: Self-pay | Source: Ambulatory Visit | Attending: Family Medicine | Admitting: Family Medicine

## 2010-12-15 ENCOUNTER — Other Ambulatory Visit: Payer: Self-pay | Admitting: Oncology

## 2010-12-15 DIAGNOSIS — M545 Low back pain, unspecified: Secondary | ICD-10-CM | POA: Insufficient documentation

## 2010-12-15 DIAGNOSIS — M549 Dorsalgia, unspecified: Secondary | ICD-10-CM

## 2010-12-15 DIAGNOSIS — G8929 Other chronic pain: Secondary | ICD-10-CM | POA: Insufficient documentation

## 2010-12-15 DIAGNOSIS — Z85118 Personal history of other malignant neoplasm of bronchus and lung: Secondary | ICD-10-CM | POA: Insufficient documentation

## 2010-12-15 DIAGNOSIS — Z09 Encounter for follow-up examination after completed treatment for conditions other than malignant neoplasm: Secondary | ICD-10-CM | POA: Insufficient documentation

## 2010-12-15 DIAGNOSIS — R52 Pain, unspecified: Secondary | ICD-10-CM

## 2010-12-21 ENCOUNTER — Other Ambulatory Visit: Payer: Self-pay | Admitting: Oncology

## 2010-12-21 ENCOUNTER — Encounter (HOSPITAL_BASED_OUTPATIENT_CLINIC_OR_DEPARTMENT_OTHER): Payer: Self-pay | Admitting: Oncology

## 2010-12-21 DIAGNOSIS — Z85118 Personal history of other malignant neoplasm of bronchus and lung: Secondary | ICD-10-CM

## 2010-12-21 LAB — CBC WITH DIFFERENTIAL/PLATELET
BASO%: 0.4 % (ref 0.0–2.0)
Basophils Absolute: 0 10e3/uL (ref 0.0–0.1)
EOS%: 4 % (ref 0.0–7.0)
Eosinophils Absolute: 0.3 10e3/uL (ref 0.0–0.5)
HCT: 37.2 % (ref 34.8–46.6)
HGB: 12.7 g/dL (ref 11.6–15.9)
LYMPH%: 18.8 % (ref 14.0–49.7)
MCH: 30.3 pg (ref 25.1–34.0)
MCHC: 34.2 g/dL (ref 31.5–36.0)
MCV: 88.7 fL (ref 79.5–101.0)
MONO#: 0.4 10e3/uL (ref 0.1–0.9)
MONO%: 5.7 % (ref 0.0–14.0)
NEUT#: 5.2 10e3/uL (ref 1.5–6.5)
NEUT%: 71.1 % (ref 38.4–76.8)
Platelets: 326 10e3/uL (ref 145–400)
RBC: 4.19 10e6/uL (ref 3.70–5.45)
RDW: 13.7 % (ref 11.2–14.5)
WBC: 7.3 10e3/uL (ref 3.9–10.3)
lymph#: 1.4 10e3/uL (ref 0.9–3.3)

## 2010-12-21 LAB — COMPREHENSIVE METABOLIC PANEL WITH GFR
ALT: 14 U/L (ref 0–35)
AST: 17 U/L (ref 0–37)
Albumin: 4.6 g/dL (ref 3.5–5.2)
Alkaline Phosphatase: 63 U/L (ref 39–117)
BUN: 20 mg/dL (ref 6–23)
CO2: 24 meq/L (ref 19–32)
Calcium: 9.5 mg/dL (ref 8.4–10.5)
Chloride: 102 meq/L (ref 96–112)
Creatinine, Ser: 0.81 mg/dL (ref 0.40–1.20)
Glucose, Bld: 96 mg/dL (ref 70–99)
Potassium: 3.9 meq/L (ref 3.5–5.3)
Sodium: 137 meq/L (ref 135–145)
Total Bilirubin: 0.2 mg/dL — ABNORMAL LOW (ref 0.3–1.2)
Total Protein: 7.6 g/dL (ref 6.0–8.3)

## 2010-12-21 LAB — LACTATE DEHYDROGENASE: LDH: 120 U/L (ref 94–250)

## 2010-12-22 ENCOUNTER — Emergency Department (HOSPITAL_BASED_OUTPATIENT_CLINIC_OR_DEPARTMENT_OTHER)
Admission: EM | Admit: 2010-12-22 | Discharge: 2010-12-22 | Disposition: A | Discharge status: Home / Self Care | Payer: Self-pay | Attending: Emergency Medicine | Admitting: Emergency Medicine

## 2010-12-22 DIAGNOSIS — G8929 Other chronic pain: Secondary | ICD-10-CM | POA: Insufficient documentation

## 2010-12-22 DIAGNOSIS — M722 Plantar fascial fibromatosis: Secondary | ICD-10-CM | POA: Insufficient documentation

## 2011-02-02 NOTE — Op Note (Signed)
NAMECHINA, Stacy [name]            ACCOUNT NO.:  [id number]   MEDICAL RECORD NO.:  [id number]          PATIENT TYPE:  AMB   LOCATION:  ENDO                         FACILITY:  [facility]   PHYSICIAN:  Petra Kuba, M.D.    DATE OF BIRTH:  [birth year]   DATE OF PROCEDURE:  DATE OF DISCHARGE:                               OPERATIVE REPORT   PROCEDURE:  Colonoscopy.   INDICATION:  Screening.  Consent was signed after risks, benefits,  methods, and options thoroughly discussed multiple times in the past.   MEDICINES USED:  1. Fentanyl 125 mcg.  2. Versed 12.5 mg.   PROCEDURE:  Rectal inspection is pertinent for external hemorrhoids,  small.  Digital exam was negative.  A video colonoscope was inserted  easily advanced around the colon to the cecum.  This did not require any  abdominal pressure or any position changes.  On insertion, some left-  sided diverticula were seen.  The cecum was identified by the  appendiceal orifice in the ileocecal valve.  The scope was slowly  withdrawn.  There is some liquid stool that required washing and  suctioning.  Overall, the prep was adequate, so withdrawn through the  colon and then the left-sided diverticula mentioned above, which were  small and scattered.  No other abnormalities were seen specifically no  polyps, tumors, masses, AVMs, signs of bleeding, etc.,  Once back in the  rectum, anorectal pull-through and retroflexion confirmed some small  hemorrhoids.  Scope was drained and readvanced towards up the left side  of the colon.  Air was suctioned and scope was removed.  The patient  tolerated the procedure well.  There were no obvious immediate  complications.   ENDOSCOPIC DIAGNOSES:  1. Internal and external hemorrhoids.  2. Left-sided scattered diverticula.  3. Otherwise, within normal limits to the cecum.   PLAN:  Repeat screening in 5-10 years.  Happy to see back p.r.n.  Return  care in [place] and [place] for the  customary health care  screening and maintenance.           ______________________________  Petra Kuba, M.D.     MEM/MEDQ  D:  06/20/2008  T:  06/21/2008  Job:  [id number]   cc:   Petra Kuba, M.D.  [facility]  Genene Churn. Cyndie Chime, M.D.

## 2011-02-05 NOTE — Procedures (Signed)
[place]  Patient:    Stacy [name], Stacy [name] Visit Number: [id number] MRN: [id number]          Service Type: END Location: ENDO Attending Physician:  Jetty Duhamel Driver Dictated by:   Rennis Chris. Maple Hudson, M.D. Proc. Date: 10/30/01 Admit Date:  10/30/2001   CC:         Genene Churn. Cyndie Chime, M.D.   Procedure Report  PROCEDURE PERFORMED:  Bronchoscopy.  ENDOSCOPIST:  Rennis Chris. Maple Hudson, M.D.  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  She has now presented with two weeks of intermittent hemoptysis.  A CT scan of the chest was negative except for slight narrowing of the right middle lobe and right lower lobe orifices without other active process seen.  Some post therapy fibrosis was note at the right hilum.  There was some coronary artery calcification.  Preoperative evaluation revealed a blood pressure 100/68.  Heart sounds and lung sounds were normal.  She seemed stable and comfortable for planned bronchoscopy.  There were no medication allergies.  Only active medication was Effexor, off of aspirin x 2 weeks.  DESCRIPTION OF PROCEDURE:  After fully informed consent, bronchoscopy was performed on an outpatient basis in the endoscopy suite.  Premedication was not given.  The upper airway was anesthetized topically with 2% Xylocaine and with Cetacaine spray.  Oxygen was provided at 8L per minute by nasal prongs. Cardiac monitor showed a normal sinus rhythm.  An Olympus fiberoptic bronchoscope was advanced via the left nostril to the level of the vocal cords without difficulty. The cords moved normally.  The nasopharynx was unremarkable.  The trachea and main carina were normal.  Sequential examination of each lobar and segmental airway bilaterally to the fourth division revealed no unusual findings in the left  lung.  There were clear mucoid secretions.  No blood or sign of recent bleeding  was found in any airway.  In the right bronchial tree there was marked stricture with some erythema at the orifice of the right middle lobe, nonspecific and possibly related to  the radiation therapy.  I could advance the cytology brush with difficulty into the middle lobe.  I could not advance the bronchoscope.  This area was brushed for cytology and lavaged with saline.  The rest of the right endobronchial tree was unremarkable.  No specific bleeding source or obvious recurrent cancer was seen.  The patient tolerated the procedure well and is being held until stable before release home with family.  FINAL IMPRESSION:  No evidence of active hemoptysis, no endobronchial tumor. Stricture which may be post radiation therapy, at the orifice of the right middle lobe suggesting the possibility of a right middle lobe syndrome with post obstructive bronchitis and bleeding.  Cancer can not be excluded. Dictated by:   Rennis Chris. Maple Hudson, M.D. Attending Physician:  Jetty Duhamel Driver DD:  56/21/30 TD:  10/30/01 Job: [id number]

## 2011-02-05 NOTE — Consult Note (Signed)
Stacy [name], Stacy [name]            ACCOUNT NO.:  [id number]   MEDICAL RECORD NO.:  [id number]          PATIENT TYPE:  OUT   LOCATION:  GYN                          FACILITY[facility]   PHYSICIAN:  Paola A. Duard Brady, MD    DATE OF BIRTH:  [birth year]   DATE OF CONSULTATION:  DATE OF DISCHARGE:                                   CONSULTATION   Ms. Stacy [name] is a very pleasant 55 year old who was referred to [place] secondary  to a large right ovarian mass.  The patient has a very complicated medical  history of small cell lung cancer.  She was diagnosed and treated in May,  2000, at which time she presented with right-sided supraclavicular  adenopathy.  She was treated with chemotherapy.  Subsequently, she was  evaluated and was noted to have a right adnexal mass.  In February, 2006, it  measured 3.4 x 5.6 cm, which appeared to be larger than prior.  She had a CT  scan of the chest, abdomen, and pelvis that revealed a cystic mass in the  right pelvis measuring approximately 7 cm.  It was felt that it looked quite  different and much larger than prior.  She was seen by me in September, 2006  and subsequently underwent diagnostic laparoscopy, laparoscopic right  salpingo-oophorectomy, left salpingectomy, extensive lysis of adhesions for  an hour on October 17th.  Operative findings included adhesive disease of  the small bowel to the deep pelvis and the right pelvic side wall, right  ovarian mass that was under the small bowel and draped over the IP,  significant adhesions of the left fallopian tube, a surgically absent left  ovary.  Final pathology was consistent with a benign fallopian tube on the  right.  The left fallopian tube was also negative.  She comes in today for  her postoperative check.  She was seen by Dr. Billy Coast postop, the note of  which is not available to me, complaining of vaginal discharge.  At that  time, it was noted that the colpotomy ring had been left in place and had  not  been removed at the completion of the surgical procedure.  She was  treated with oral antibiotics and the coring was removed.  She has had no  subsequent sequelae since that time.  She comes in today for her  postoperative check.  She is otherwise doing quite well.  She was  understandably quite upset about the colpotomy ring having been retained.  It was removed, and she understands there is no long term sequelae, and she  herself has not had any sequelae.  She is distraught about the cost of the  antibiotics and the cost of Dr. Jorene [name] visit and the fact that she does  not have any insurance and is unemployed and cannot pay her bills.   PHYSICAL EXAMINATION:  VITAL SIGNS:  Weight 171 pounds.  ABDOMEN:  Well-healed laparoscopy skin incisions.  Abdomen is soft and  nontender.  PELVIC:  External genitalia is within normal limits.  The vagina is  atrophic.  The cervix is visualized.  It is foreshortened.  There are no  mucosal vaginal ulcerations, discharge, or lesions noted.  Bimanual  examination:  There is no cervical motion tenderness.  There are no palpable  masses.  The vagina is smooth.   ASSESSMENT:  A 55 year old with a history of small cell lung cancer, status  post laparoscopic right salpingo-oophorectomy and left salpingectomy for  benign disease.   PLAN:  1.  She is doing well from a postoperative standpoint.  She is very pleased      that the results were benign and this did not represent a new malignant      process or metastatic disease from her non-small cell lung cancer.  We      discussed at length the issue of the colpotomy ring and that the issue      has been evaluated fully.  She appears to have had no long-term sequelae      from this.  2.  Regarding her vasomotor symptoms, we discussed the possibility of going      on estrogen, which she does not wish to proceed with.  The other option      we discussed, as the estrogen would most likely protect her from       osteopenia, is a bone density study in about a year with repeat bone      density studies about every two years and at the time that she starts      developing osteopenia, consider medications.  We will defer to her      primary physicians for discussion of this.  She knows we will be happy      to see her in the future should the need arise.      Paola A. Duard Brady, MD  Electronically Signed     PAG/MEDQ  D:  10/06/2005  T:  10/06/2005  Job:  [id number]   cc:   Lenoard Aden, M.D.  Fax: [phone]   Telford Nab, R.N.  [address]  [place], [place] [address]   Andee Poles, M.D.  Fax: [phone]   Maryln Gottron, M.D.  Fax: [phone]   Dr. Redmond School   Clinton D. Maple Hudson, M.D.  [facility]  [address]  [place]  [place] [address]   Ines Bloomer, M.D.  [address]  [place]  [place] [address]

## 2011-06-28 LAB — BASIC METABOLIC PANEL
CO2: 24
Glucose, Bld: 138 — ABNORMAL HIGH
Potassium: 3.2 — ABNORMAL LOW
Sodium: 137

## 2011-06-28 LAB — POCT CARDIAC MARKERS
CKMB, poc: <1 — ABNORMAL LOW
Myoglobin, poc: 29.5
Myoglobin, poc: 34
Operator id: 4761

## 2011-06-28 LAB — CBC
HCT: 35.8 — ABNORMAL LOW
Hemoglobin: 12.4
MCHC: 34.6
RDW: 13.9

## 2011-06-28 LAB — DIFFERENTIAL
Basophils Absolute: 0
Basophils Relative: 0
Lymphocytes Relative: 15
Monocytes Absolute: 0.5
Neutro Abs: 8.7 — ABNORMAL HIGH
Neutrophils Relative %: 78 — ABNORMAL HIGH

## 2011-06-28 LAB — D-DIMER, QUANTITATIVE: D-Dimer, Quant: 0.23

## 2011-07-03 ENCOUNTER — Emergency Department (HOSPITAL_COMMUNITY): Payer: Self-pay

## 2011-07-03 ENCOUNTER — Inpatient Hospital Stay (HOSPITAL_COMMUNITY)
Admission: EM | Admit: 2011-07-03 | Discharge: 2011-07-05 | DRG: 392 | Disposition: A | Discharge status: Home / Self Care | Payer: Self-pay | Attending: Internal Medicine | Admitting: Internal Medicine

## 2011-07-03 ENCOUNTER — Inpatient Hospital Stay (HOSPITAL_COMMUNITY): Payer: Self-pay

## 2011-07-03 DIAGNOSIS — A088 Other specified intestinal infections: Principal | ICD-10-CM | POA: Diagnosis present

## 2011-07-03 DIAGNOSIS — K644 Residual hemorrhoidal skin tags: Secondary | ICD-10-CM | POA: Diagnosis present

## 2011-07-03 DIAGNOSIS — Z85118 Personal history of other malignant neoplasm of bronchus and lung: Secondary | ICD-10-CM

## 2011-07-03 DIAGNOSIS — K219 Gastro-esophageal reflux disease without esophagitis: Secondary | ICD-10-CM | POA: Diagnosis present

## 2011-07-03 DIAGNOSIS — M549 Dorsalgia, unspecified: Secondary | ICD-10-CM | POA: Diagnosis present

## 2011-07-03 DIAGNOSIS — K648 Other hemorrhoids: Secondary | ICD-10-CM | POA: Diagnosis present

## 2011-07-03 DIAGNOSIS — F341 Dysthymic disorder: Secondary | ICD-10-CM | POA: Diagnosis present

## 2011-07-03 DIAGNOSIS — E860 Dehydration: Secondary | ICD-10-CM | POA: Diagnosis present

## 2011-07-03 DIAGNOSIS — E876 Hypokalemia: Secondary | ICD-10-CM | POA: Diagnosis not present

## 2011-07-03 LAB — COMPREHENSIVE METABOLIC PANEL
ALT: 16 U/L (ref 0–35)
Alkaline Phosphatase: 79 U/L (ref 39–117)
BUN: 16 mg/dL (ref 6–23)
CO2: 24 mEq/L (ref 19–32)
Calcium: 9.5 mg/dL (ref 8.4–10.5)
Glucose, Bld: 99 mg/dL (ref 70–99)
Potassium: 3.7 mEq/L (ref 3.5–5.1)
Sodium: 138 mEq/L (ref 135–145)
Total Protein: 8.1 g/dL (ref 6.0–8.3)

## 2011-07-03 LAB — DIFFERENTIAL
Basophils Relative: 0 % (ref 0–1)
Lymphocytes Relative: 13 % (ref 12–46)
Monocytes Absolute: 0.7 10*3/uL (ref 0.1–1.0)
Monocytes Relative: 8 % (ref 3–12)
Neutro Abs: 6.3 10*3/uL (ref 1.7–7.7)
Neutrophils Relative %: 77 % (ref 43–77)

## 2011-07-03 LAB — CBC
HCT: 38.7 % (ref 36.0–46.0)
Hemoglobin: 12.4 g/dL (ref 12.0–15.0)
MCH: 29 pg (ref 26.0–34.0)
MCHC: 32 g/dL (ref 30.0–36.0)
RBC: 4.27 MIL/uL (ref 3.87–5.11)

## 2011-07-03 LAB — URINALYSIS, ROUTINE W REFLEX MICROSCOPIC
Bilirubin Urine: NEGATIVE
Nitrite: NEGATIVE
Protein, ur: NEGATIVE mg/dL
Urobilinogen, UA: 0.2 mg/dL (ref 0.0–1.0)

## 2011-07-03 LAB — LIPASE, BLOOD: Lipase: 28 U/L (ref 11–59)

## 2011-07-03 MED ORDER — IOHEXOL 300 MG/ML  SOLN
100.0000 mL | Freq: Once | INTRAMUSCULAR | Status: AC | PRN
  Administered 2011-07-03: 100 mL via INTRAVENOUS

## 2011-07-04 ENCOUNTER — Inpatient Hospital Stay (HOSPITAL_COMMUNITY): Payer: Self-pay

## 2011-07-04 LAB — BASIC METABOLIC PANEL
BUN: 10 mg/dL (ref 6–23)
CO2: 26 mEq/L (ref 19–32)
Chloride: 101 mEq/L (ref 96–112)
GFR calc Af Amer: >90 mL/min (ref >90)
Glucose, Bld: 102 mg/dL — ABNORMAL HIGH (ref 70–99)
Potassium: 3.4 mEq/L — ABNORMAL LOW (ref 3.5–5.1)

## 2011-07-04 LAB — CBC
HCT: 39.1 % (ref 36.0–46.0)
Hemoglobin: 12.6 g/dL (ref 12.0–15.0)
MCH: 29 pg (ref 26.0–34.0)
MCV: 90.1 fL (ref 78.0–100.0)
RBC: 4.34 MIL/uL (ref 3.87–5.11)
WBC: 7.8 10*3/uL (ref 4.0–10.5)

## 2011-07-04 MED ORDER — IOHEXOL 300 MG/ML  SOLN
100.0000 mL | Freq: Once | INTRAMUSCULAR | Status: AC | PRN
  Administered 2011-07-04: 100 mL via INTRAVENOUS

## 2011-07-04 MED ORDER — IOHEXOL 300 MG/ML  SOLN: 100.0000 mL | Freq: Once | INTRAMUSCULAR | Status: AC | PRN

## 2011-07-05 LAB — OVA AND PARASITE EXAMINATION: Ova and parasites: NONE SEEN

## 2011-07-07 LAB — STOOL CULTURE

## 2011-07-23 NOTE — H&P (Signed)
Stacy [name], Stacy [name] NO.:  [id number]  MEDICAL RECORD NO.:  [id number]  LOCATION:  1532                         FACILITY:  [facility]  PHYSICIAN:  Rosanna Randy, MDDATE OF BIRTH:  1956/07/06  DATE OF ADMISSION:  07/03/2011 DATE OF DISCHARGE:                             HISTORY & PHYSICAL   ONCOLOGIST:  Genene Churn. Cyndie Chime, MD, FACP  CHIEF COMPLAINT:  Abdominal pain for the last 4 days, also decreased appetite.  HISTORY OF PRESENT ILLNESS:  The patient is a 55 year old female with a past medical history significant for small cell lung cancer, currently in remission; ovarian mass, status post hysterectomy and salpingo- oophorectomy; and also internal and external hemorrhoids, who came in to the hospital complaining of abdominal pain in kind of umbilical area, midepigastric; no radiation; that has been present for the last 4 days, worsening and progressive.  The patient reports associated decreased appetite and just uncomfortable feeling.  She denies any diarrhea, any nausea, any vomiting, any fever or chills.  There were no sick contacts or recent travels.  There were also no changes on her regular food that she could associate with the development of this pain.  The patient came to the emergency department for further evaluation and treatment; and in the emergency department, a CT of her abdomen demonstrated nonspecific tissue stranding within the jejunal mesenteric fat.  Differential includes inflammatory etiologies such as mesenteric adenitis as well as lymphoproliferative disorder and metastatic disease.  Because of the patient's history of cancer and the fact that her pain did not relieve with narcotics in the ED, she was admitted for further evaluation and treatment.  ALLERGIES:  No known drug allergies.  PAST MEDICAL HISTORY: 1. Small cell lung cancer. 2. History of placenta accreta. 3. History of ovarian mass, status post  salpingo-oophorectomy. 4. History of hysterectomy. 5. Prior history of tobacco abuse. 6. History of gastroesophageal reflux disease.  MEDICATIONS:  Med rec has not been reconciled at the moment of the dictation.  Per the patient, just using over-the-counter medications and a multivitamin.  We will have pharmacy to reconcile medications and review them later.  SOCIAL HISTORY:  She works in a childcare facility.  Has a history of former tobacco abuse.  Occasional alcohol ingestion.  Denies recreational drugs.  FAMILY HISTORY:  Significant on her father's side for diabetes and congestive heart failure; and on her mom, COPD.  REVIEW OF SYSTEMS:  Otherwise negative except as mentioned in the HPI.  PHYSICAL EXAMINATION:  VITAL SIGNS:  Temperature 98.3, heart rate 106, respiratory rate 16, blood pressure 112/72, oxygen saturation 97% on room air. GENERAL:  The patient was lying in bed, mild distress secondary to uncomfortable sensation on her belly.  HEENT:  Normocephalic.  No trauma.  Eyes:  PERRLA, extraocular muscles intact.  No icterus, no nystagmus.  Mild dryness on her mucous membranes.  No erythema.  No exudates.  Good dentition.  No drainage out of her ears or her nostrils. NECK:  Supple.  No thyromegaly. RESPIRATORY SYSTEM:  Clear to auscultation bilaterally. HEART:  Mild tachycardia.  No rubs, no gallops.  S1 and S2 appreciated. ABDOMEN:  Soft.  Positive bowel sounds.  No guarding.  The  patient with significant discomfort above her umbilical area.  Pain extended across her abdomen in this region. EXTREMITIES:  No edema. SKIN:  No rash or petechiae. NEUROLOGIC EXAM:  Nonfocal.  Cranial nerves II-XII grossly intact. Muscle strength 5/5 bilaterally symmetrically.  The patient alert, awake, and oriented x3; able to follow commands.  Deep tendon reflexes bilaterally symmetrical, 2+.  PERTINENT LABORATORY DATA:  Includes a urinalysis and microscopy, both completely normal except  for traces of leukocyte esterase on the urinalysis.  Lipase 28.  Comprehensive metabolic panel; sodium 138, potassium 3.7, chloride 103, bicarbonate 24, glucose 99, BUN 16, creatinine 0.47.  Bilirubin 0.1.  LFTs completely normal.  CBC with white blood cells of 8.3, hemoglobin 12.4, platelets 333.  ASSESSMENT AND PLAN: 1. Abdominal pain.  This might be secondary to acute gastroenteritis,     viral versus bacterial.  Given that on the bacterial side, the     patient not having any fever, diarrhea, or any elevation of her     white blood cells making that less likely.  We will admit the     patient, provide fluid resuscitation, p.r.n. antiemetics, and also     p.r.n. pain medications.  Due to the history of small cell     carcinoma and also the history of ovarian mass, we will go ahead     and have CT of her chest to complete evaluation, make sure that     there is no other active disease that suggest that she is having a     metastatic lesion in her mesenteric area.  On the CT of abdomen and     pelvis already done on the moment of admission, the pelvis is not     demonstrating any acute intrapelvic lesion or any acute     abnormality.  We will hold off on any further studies.  We will     also hold antibiotics at this point, but we will go ahead based on     the trace of leukocyte esterase, get a urine culture and we will     also order some stool cultures. 2. History of small cell lung cancer.  She is following with Dr.     Cyndie Chime as an outpatient.  Will perform a CT scan of the chest     for further evaluation as indicated on #1.  Unless that the     findings require further assistance, we will hold off on calling     Hematology/Oncology. 3. Tachycardia, most likely due to mild dehydration.  We will provide     fluid resuscitation. 4. Mild dehydration.  We will provide IV fluid resuscitation. 5. Deep venous thrombosis.  We are going to use Lovenox. 6. History of  gastroesophageal reflux disease.  Not currently on     medication.  We will start the patient on Protonix 40 mg by mouth     daily.  Further workup based on initial workup results and the     patient's evolution throughout this hospitalization.     Rosanna Randy, MD     CEM/MEDQ  D:  07/03/2011  T:  07/03/2011  Job:  [id number]  cc:   [facility] Fax: [phone]  Levert Feinstein, M.D., F.A.C.P. Fax: [phone]  Electronically Signed by Vassie Loll MD on 07/23/2011 10:31:15 PM

## 2011-07-23 NOTE — Discharge Summary (Signed)
NAMESENTA, Stacy [name]            ACCOUNT NO.:  [id number]  MEDICAL RECORD NO.:  [id number]  LOCATION:  1532                         FACILITY:  [facility]  PHYSICIAN:  Rosanna Randy, MDDATE OF BIRTH:  March 17, 1956  DATE OF ADMISSION:  07/03/2011 DATE OF DISCHARGE:  07/05/2011                              DISCHARGE SUMMARY   DISCHARGE DIAGNOSES: 1. Abdominal pain secondary to gastroenteritis presumed to be viral in     etiology. 2. Mild dehydration. 3. Nausea, vomiting, and diarrhea associated with abdominal pain     secondary to gastroenteritis presumed to be viral in etiology. 4. Hypokalemia secondary to poorly contraction and also diarrhea. 5. Gastroesophageal reflux disease. 6. Depression/anxiety. 7. Hypothyroidism. 8. Dyslipidemia. 9. History of a small cell lung cancer, status post chemotherapy and     radiation and currently on remission. 10.Prior history of tobacco abuse. 11.History of hysterectomy and salpingo-oophorectomy. 12.History of ovarian mass. 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  DISCHARGE MEDICATIONS:  Includes: 1. Vicodin 2 tablets by mouth every 8 hours as needed for abdominal     pain. 2. Zofran 4 mg by mouth every 6 hours as needed for nausea/vomiting. 3. Protonix 40 mg 1 tablet by mouth twice a day. 4. Simethicone 80 mg 1 tablet by mouth every 6 hours as needed for     bloating/obstipation. 5. Alprazolam 0.5 mg to take half tablet by mouth daily at bedtime as     needed for anxiety. 6. Effexor 150 mg extended release capsules by mouth twice a day. 7. Fish oil 1 g 1 capsule by mouth daily. 8. Multiple multivitamins 1 tablet by mouth daily. 9. Levothyroxine 75 mcg 1 tablet by mouth every morning on empty     stomach. 10.Vitamin D3 100 units 1 tablet by mouth daily.  DISPOSITION AND FOLLOWUP:  The patient had been discharged in a stable improved condition.  Currently, not complaining of any nausea or any vomiting and able to tolerate POs.  The patient  reports still having a little sensation of bloating and also on mild discomfort in her abdomen, but definitely not as bad as it was on the moment of admission.  Patient had received instructions to continue advancing her diet slowly and to take her medication as prescribed and to arrange a followup appointment with primary care physician at the [facility] over the next 10 to 14 days.  Patient had received instructions to avoid the use of ibuprofen and also aspirin.  Since these 2 medications might be worsening her reflux and this dyspepsia symptoms, patient will also arrange followup with Dr. Cyndie Chime as previously indicated prior to come into the hospital.  PROCEDURE PERFORMED DURING THIS HOSPITALIZATION:  Patient has a CT of the abdomen and pelvis with contrast that demonstrated nonspecific soft tissue stranding within the jejunal mesenteric fat, otherwise known as misty mesentery, in the majority of the cases it is a benign finding, however, differential includes inflammatory etiologies such as mesenteric adenitis as well as lymphoproliferative disorder, and metastatic disease.  Due to the history of cancer, patient had a CT of the chest with contrast in order to evaluate on her lung status and make sure that there was no recurrence of her small cell  cancer.  The results of the CT of the chest demonstrated right paramediastinal radiation changes, no evidence of recurrent/metastatic disease in the chest.  No other procedures were performed during this hospitalization.  No consultations were made.  PERTINENT LABORATORY DATA:  Includes a CBC with differential on the moment of admission that showed a white blood cells of 8.3, hemoglobin 12.4, platelets 333.  Comprehensive metabolic panel was completely normal with a sodium of 138, potassium 3.7, chloride 103, bicarb 24, glucose 99, BUN 16, creatinine 0.47.  Completely normal LFTs, lipase was 28, amylase was 27.  Urinalysis trace of  leukocytes, but nitrite was negative and there were just 0 to 2 white blood cells on the microscopy. TSH was 1.771.  Stool cultures were also done and at the moment of the dictation, no growth.  HISTORY OF PRESENT ILLNESS:  For full details, please refer to dictation done by myself on July 03, 2011.  Briefly, this is a 55 year old female with a past medical history significant for small cell lung cancer, currently in remission; ovarian mass, status post hysterectomy and salpingo-oophorectomy; and also internal and external hemorrhoids, who came into the hospital complaining of abdominal pain, both her umbilical area/mid epigastric region.  There was no radiation associated with the pain and the pain has been present for the last 4 days prior to admission in a worsening pattern.  Patient reports associated decreased appetite and just uncomfortable feeling.  Patient denies any nausea, vomiting, fever, or chills and reports no sick contacts or any recent trips.  HOSPITAL COURSE BY PROBLEM: 1. Abdominal pain secondary to gastroenteritis presumed to be viral.     At this point, she received supportive care throughout this     hospitalization with fluid resuscitation, antiemetics, and also     pain medications and at this moment she is feeling a lot better,     comfortable, and able to tolerate POs.  Patient is going to be     discharged home with instructions to keep herself hydrated and to     continue using the medications for supportive treatment.  She is     going to arrange a followup appointment at the [facility] in 10     to 14 days. 2. Dehydration.  The patient received fluid resuscitation.  At     discharge, there was no orthostatic changes and vital signs were     completely stable. 3. Nausea, vomiting, and diarrhea.  All associated with the     gastroenteritis symptoms well controlled, pretty much resolved at     the moment of discharge. 4. Hypokalemia secondary to  diarrhea and volume contraction.     Electrolytes was repleted, at discharge potassium within normal     limits. 5. Gastroesophageal reflux disease.  Patient will be using Protonix 40     mg 1 tablet by mouth twice a day and is going to follow for further     assessment of this condition by primary care physician as an     outpatient. 6. Depression and anxiety.  She will continue using p.r.n. alprazolam     and will continue using Effexor. 7. Hypothyroidism, stable with a normal TSH.  We will continue using     Synthroid 75 mcg. 8. Dyslipidemia.  At this point, patient had been instructed to follow     a low-fat diet and to continue the use of fish oil.  Further     adjustment to her medications for this problem to  be determined by     primary care physician, involving her followup appointments.  The     rest of her medical problems remained stable throughout this     hospitalization and no changes have been made to her medication     regimen.  PHYSICAL EXAMINATION:  VITAL SIGNS:  At discharge, temperature 97.5, heart rate 86, respiratory rate 16, blood pressure 117/72, oxygen saturation 95% on room air. GENERAL:  In no acute distress. RESPIRATORY SYSTEM:  Clear to auscultation bilaterally. HEART:  Regular rate and rhythm.  No murmurs, gallops, or rubs. ABDOMEN:  Soft, nondistended.  Positive bowel sounds.  Mild generalized soreness. EXTREMITIES:  Without cyanosis, edema, or clubbing.  NEUROLOGIC EXAM: Nonfocal deficits.     Rosanna Randy, MD     CEM/MEDQ  D:  07/05/2011  T:  07/05/2011  Job:  [id number]  cc:   Levert Feinstein, M.D., F.A.C.P. Fax: [phone]  [facility]  Electronically Signed by Vassie Loll MD on 07/23/2011 10:31:06 PM

## 2011-11-04 ENCOUNTER — Telehealth: Payer: Self-pay | Admitting: Oncology

## 2011-11-04 ENCOUNTER — Telehealth: Payer: Self-pay | Admitting: *Deleted

## 2011-11-04 NOTE — Telephone Encounter (Signed)
Called pt, back and left a message to call us back.

## 2011-11-04 NOTE — Telephone Encounter (Signed)
Several return calls to pt; last one @ 5pm, unable to reach her, message left for pt to call back tomorrow.  Per Dr. Cyndie Chime, we can order CXR.  Will wait for return call.

## 2011-11-04 NOTE — Telephone Encounter (Signed)
Received vm call from pt stating that she can't wait till April to se Dr. Cyndie Chime & reports trouble breathing & mucous since before Christmas that she can't shake.  She reports no insurance & she doesn't have an inhaler & is wheezing all the time.  Returned call & got vm & asked her to call back with more details & asked if she has seen her PCP.  Will wait on return call.

## 2011-11-05 ENCOUNTER — Other Ambulatory Visit: Payer: Self-pay | Admitting: *Deleted

## 2011-11-05 DIAGNOSIS — Z85118 Personal history of other malignant neoplasm of bronchus and lung: Secondary | ICD-10-CM

## 2011-11-10 ENCOUNTER — Ambulatory Visit (HOSPITAL_COMMUNITY)
Admission: RE | Admit: 2011-11-10 | Discharge: 2011-11-10 | Disposition: A | Discharge status: Home / Self Care | Payer: Self-pay | Source: Ambulatory Visit | Attending: Oncology | Admitting: Oncology

## 2011-11-10 DIAGNOSIS — R0602 Shortness of breath: Secondary | ICD-10-CM | POA: Insufficient documentation

## 2011-11-10 DIAGNOSIS — J3489 Other specified disorders of nose and nasal sinuses: Secondary | ICD-10-CM | POA: Insufficient documentation

## 2011-11-10 DIAGNOSIS — Z85118 Personal history of other malignant neoplasm of bronchus and lung: Secondary | ICD-10-CM | POA: Insufficient documentation

## 2011-11-10 DIAGNOSIS — R062 Wheezing: Secondary | ICD-10-CM | POA: Insufficient documentation

## 2011-11-11 ENCOUNTER — Telehealth: Payer: Self-pay | Admitting: *Deleted

## 2011-11-11 ENCOUNTER — Telehealth: Payer: Self-pay | Admitting: Oncology

## 2011-11-11 NOTE — Telephone Encounter (Signed)
Called pt, left message regarding lab and MD visit in April 2013

## 2011-11-11 NOTE — Telephone Encounter (Signed)
Notified pt that CXR showed stable radiation changes, no pneumonia, & no recurrence of lung CA per Dr. Cyndie Chime.  She ask, " What am I supposed to do about all this green stuff that I'm coughing up & wheezing.  She reports that she had an inhaler that helped some but doesn't have any more.  She has also tried mucinex which helped some.  Suggested OTC robitussin for the cough & OK for mucinex or talk with pharmacist for suggestions.  Will discuss with Dr Cyndie Chime for other recommendations.

## 2011-11-12 ENCOUNTER — Other Ambulatory Visit: Payer: Self-pay | Admitting: *Deleted

## 2011-11-12 DIAGNOSIS — Z85118 Personal history of other malignant neoplasm of bronchus and lung: Secondary | ICD-10-CM

## 2011-11-12 MED ORDER — AMOXICILLIN 500 MG PO CAPS: 500.0000 mg | ORAL_CAPSULE | Freq: Three times a day (TID) | ORAL | Status: AC

## 2011-11-12 NOTE — Telephone Encounter (Signed)
Pt left message to call in ATB to [facility] but if too expensive may not be able to get.  Dr.Granfortuna had suggested biaxin 500mg  bid.  Called [facility] & script would be $93.54 & asked for suggestion for something less expensive.  Amoxicillin was on $4.00 list.  This was OK'd by Dr. Cyndie Chime & sent to pharmacy electronically & pt notified via vm message to cell that script called in on $4.00 list.

## 2011-12-31 ENCOUNTER — Other Ambulatory Visit: Payer: Self-pay | Admitting: Lab

## 2012-01-03 ENCOUNTER — Telehealth: Payer: Self-pay | Admitting: Oncology

## 2012-01-03 NOTE — Telephone Encounter (Signed)
Received a note from pt, called pt and left a message regarding appt change. Pt is requesting that appt be change to early am or late pm. First opening for MD is July 2013

## 2012-01-04 ENCOUNTER — Encounter: Payer: Self-pay | Admitting: Oncology

## 2012-01-04 ENCOUNTER — Ambulatory Visit: Payer: Self-pay | Admitting: Oncology

## 2012-01-04 NOTE — Progress Notes (Signed)
The patient failed to report for her visit today.  Is as a 56 year old woman with a history of limited stage small cell lung cancer presenting with right supraclavicular lymphadenopathy. She was treated with aggressive chemotherapy and radiation and achieved a complete and durable response. She called our office a few weeks ago complaining of a cough. I ordered a chest radiograph which was done on 11/10/2011. She has chronic changes from previous radiation pneumonitis but no evidence for obvious recurrence. She has limited financial resources and probably didn't come in today since we told her chest x-ray was negative.

## 2012-02-22 ENCOUNTER — Telehealth: Payer: Self-pay | Admitting: Oncology

## 2012-02-22 NOTE — Telephone Encounter (Signed)
Pt called and wants to make appt with Dr. Omer Jack, pt was no show in April 2013, pt now scheduled for August , emailed Md regarding x-ray

## 2012-02-23 ENCOUNTER — Other Ambulatory Visit: Payer: Self-pay | Admitting: Oncology

## 2012-02-23 ENCOUNTER — Telehealth: Payer: Self-pay | Admitting: Oncology

## 2012-02-23 NOTE — Telephone Encounter (Signed)
Called pt and left message regarding appt being made for August, MD is ok with August appt

## 2012-04-21 ENCOUNTER — Other Ambulatory Visit (HOSPITAL_BASED_OUTPATIENT_CLINIC_OR_DEPARTMENT_OTHER): Payer: Self-pay | Admitting: Lab

## 2012-04-21 DIAGNOSIS — C3410 Malignant neoplasm of upper lobe, unspecified bronchus or lung: Secondary | ICD-10-CM

## 2012-04-21 LAB — CBC WITH DIFFERENTIAL/PLATELET
Basophils Absolute: 0 10*3/uL (ref 0.0–0.1)
EOS%: 3 % (ref 0.0–7.0)
Eosinophils Absolute: 0.3 10*3/uL (ref 0.0–0.5)
HGB: 12.1 g/dL (ref 11.6–15.9)
LYMPH%: 20 % (ref 14.0–49.7)
MCH: 30.3 pg (ref 25.1–34.0)
MCV: 91.2 fL (ref 79.5–101.0)
MONO%: 6.7 % (ref 0.0–14.0)
NEUT#: 6.4 10*3/uL (ref 1.5–6.5)
NEUT%: 70 % (ref 38.4–76.8)
Platelets: 309 10*3/uL (ref 145–400)
RDW: 13.6 % (ref 11.2–14.5)

## 2012-04-21 LAB — COMPREHENSIVE METABOLIC PANEL
AST: 16 U/L (ref 0–37)
Albumin: 4.4 g/dL (ref 3.5–5.2)
Alkaline Phosphatase: 66 U/L (ref 39–117)
BUN: 21 mg/dL (ref 6–23)
Creatinine, Ser: 0.66 mg/dL (ref 0.50–1.10)
Glucose, Bld: 77 mg/dL (ref 70–99)
Potassium: 3.6 mEq/L (ref 3.5–5.3)
Total Bilirubin: 0.1 mg/dL — ABNORMAL LOW (ref 0.3–1.2)

## 2012-04-24 ENCOUNTER — Encounter: Payer: Self-pay | Admitting: Oncology

## 2012-04-24 ENCOUNTER — Ambulatory Visit (HOSPITAL_BASED_OUTPATIENT_CLINIC_OR_DEPARTMENT_OTHER): Payer: Self-pay | Admitting: Oncology

## 2012-04-24 VITALS — BP 129/88 | HR 103 | Temp 97.9°F | Resp 20 | Wt 188.9 lb

## 2012-04-24 DIAGNOSIS — D279 Benign neoplasm of unspecified ovary: Secondary | ICD-10-CM

## 2012-04-24 DIAGNOSIS — J700 Acute pulmonary manifestations due to radiation: Secondary | ICD-10-CM

## 2012-04-24 DIAGNOSIS — F418 Other specified anxiety disorders: Secondary | ICD-10-CM

## 2012-04-24 DIAGNOSIS — N642 Atrophy of breast: Secondary | ICD-10-CM

## 2012-04-24 DIAGNOSIS — R062 Wheezing: Secondary | ICD-10-CM

## 2012-04-24 DIAGNOSIS — C3490 Malignant neoplasm of unspecified part of unspecified bronchus or lung: Secondary | ICD-10-CM

## 2012-04-24 DIAGNOSIS — Z85118 Personal history of other malignant neoplasm of bronchus and lung: Secondary | ICD-10-CM

## 2012-04-24 DIAGNOSIS — F341 Dysthymic disorder: Secondary | ICD-10-CM

## 2012-04-24 DIAGNOSIS — E039 Hypothyroidism, unspecified: Secondary | ICD-10-CM

## 2012-04-24 DIAGNOSIS — Z8719 Personal history of other diseases of the digestive system: Secondary | ICD-10-CM

## 2012-04-24 HISTORY — DX: Hypothyroidism, unspecified: E03.9

## 2012-04-24 HISTORY — DX: Other specified anxiety disorders: F41.8

## 2012-04-24 HISTORY — DX: Acute pulmonary manifestations due to radiation: J70.0

## 2012-04-24 HISTORY — DX: Personal history of other diseases of the digestive system: Z87.19

## 2012-04-24 HISTORY — DX: Benign neoplasm of unspecified ovary: D27.9

## 2012-04-24 HISTORY — DX: Malignant neoplasm of unspecified part of unspecified bronchus or lung: C34.90

## 2012-04-24 NOTE — Progress Notes (Signed)
Hematology and Oncology Follow Up Visit  Stacy [name] [id number] 1955/11/11 56 y.o. 04/24/2012 5:03 PM   Principle Diagnosis: Encounter Diagnosis  Name Primary?  . Small cell lung cancer Yes     Interim History:  Followup visit for this 56 year old woman who presented with an enlarged right supraclavicular lymph node in May of 2000 as the first sign of limited stage small cell lung cancer. She was treated very aggressively with chemotherapy and radiation therapy.  Other than developing  radiation pneumonitis, which has left some chronic scarring in the right hilar area on chest radiographic and CT, she suffered no major complications from treatment and remains in a remission at this time, out an amazing 13. years.   She continues to have major social and financial problems. 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 She was getting most of her primary care through [facility] which is now being forced to close.  Since visit here last year she had one emergency department visit for acute onset of abdominal pain and was admitted  to the hospital for evaluation. She admits taking large amounts of aspirin and nonsteroidal anti-inflammatories for chronic back pain. A CT scan of the abdomen and pelvis was grossly normal. Upper endoscopy not done during that admission. She was felt to have a acute viral gastroenteritis. She was treated conservatively. She was started on Protonix.  She reports intermittent dyspnea and wheezing.  Only other concern is that her right breast has atrophied at least one cup size below her left breast. This was the same side that her tumor was on and she did get full dose radiation and I wonder whether this might be responsible?  Medications: reviewed  Allergies: Not on File  Review of Systems: Constitutional:   Chronically tired Respiratory: See  above Cardiovascular:  No ischemic type chest pain or palpitations Gastrointestinal: No abdominal pain other than noted above. No change in bowel habit Genito-Urinary: Not questioned Musculoskeletal: Chronic back pain Neurologic: No headache or change in vision Skin: No rash Remaining ROS negative.  Physical Exam: Blood pressure 129/88, pulse 103, temperature 97.9 F (36.6 C), temperature source Oral, resp. rate 20, weight 188 lb 14.4 oz (85.684 kg). Wt Readings from Last 3 Encounters:  04/24/12 188 lb 14.4 oz (85.684 kg)     General appearance: Well-nourished Caucasian woman HENNT: Pharynx no erythema or exudate Lymph nodes: No cervical supraclavicular or axillary adenopathy Breasts: Not examined Lungs: Clear to auscultation resonant to percussion. No wheezing at present Heart: Regular rhythm no murmur or gallop Abdomen: Soft nontender no mass no organomegaly Extremities: No edema no calf tenderness Vascular: No cyanosis Neurologic: Grossly normal motor strength is 5 over 5 reflexes are 3+ at the knees 2+ at the biceps Skin: No rash or ecchymosis  Lab Results: Lab Results  Component Value Date   WBC 9.2 04/21/2012   HGB 12.1 04/21/2012   HCT 36.4 04/21/2012   MCV 91.2 04/21/2012   PLT 309 04/21/2012     Chemistry      Component Value Date/Time   NA 138 04/21/2012 1624   K 3.6 04/21/2012 1624   CL 99 04/21/2012 1624   CO2 29 04/21/2012 1624   BUN 21 04/21/2012 1624   CREATININE 0.66 04/21/2012 1624      Component Value Date/Time   CALCIUM 9.7 04/21/2012 1624   ALKPHOS 66 04/21/2012 1624  AST 16 04/21/2012 1624   ALT 15 04/21/2012 1624   BILITOT 0.1* 04/21/2012 1624       Radiological Studies: Most recent chest radiograph done 11/10/2011 which I personally reviewed shows chronic changes related to previous radiation pneumonitis with resultant scarring. No evidence for new malignancy. No results found.  Impression and Plan: #1. Limited stage small cell lung cancer treated with radiation  and surgery. She is now out 13 years from initial diagnosis and likely cured. I am not getting just an annual chest x-ray.  #2. History of right salpingo-oophorectomy October 2006 for a benign serous cystadenoma.  #3. History of low-grade ulcerative colitis.  #4. Recent episode of nonspecific gastroenteritis.  #5. 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  #6. Right breast atrophy. Possible complication of previous chest radiation. Last mammogram done November 2011. She has limited financial resources which has been an obstacle in her health maintenance exams.  #7. Intermittent wheezing and dyspnea on exertion. I am prescribing an albuterol inhaler 2 puffs every 6 hours when necessary  #8. Chronic anxiety and depression.  #9. Hypothyroid likely iatrogenic from previous radiation. previously on replacement with 75 mcg of Synthroid. Her exam today certainly does not suggest hypothyroidism with 3+ reflexes at the knees.  CC:. Dr. Jetty Duhamel; Dr. Leary Roca; Dr. Chipper Herb   Levert Feinstein, MD 8/5/20135:03 PM

## 2012-04-26 ENCOUNTER — Telehealth: Payer: Self-pay | Admitting: *Deleted

## 2012-04-26 NOTE — Telephone Encounter (Signed)
Gave  Patient appointment 11-07-2012 starting at 4:00pm with labs

## 2012-05-01 ENCOUNTER — Telehealth: Payer: Self-pay | Admitting: *Deleted

## 2012-05-01 ENCOUNTER — Other Ambulatory Visit: Payer: Self-pay | Admitting: *Deleted

## 2012-05-01 DIAGNOSIS — C3490 Malignant neoplasm of unspecified part of unspecified bronchus or lung: Secondary | ICD-10-CM

## 2012-05-01 MED ORDER — ALBUTEROL SULFATE HFA 108 (90 BASE) MCG/ACT IN AERS: 2.0000 | INHALATION_SPRAY | Freq: Four times a day (QID) | RESPIRATORY_TRACT | Status: DC | PRN

## 2012-05-01 NOTE — Telephone Encounter (Signed)
Telephoned patient at home # and left message to return call to [facility] 

## 2012-05-10 ENCOUNTER — Emergency Department (HOSPITAL_COMMUNITY)
Admission: EM | Admit: 2012-05-10 | Discharge: 2012-05-10 | Disposition: A | Discharge status: Home / Self Care | Payer: Self-pay | Attending: Emergency Medicine | Admitting: Emergency Medicine

## 2012-05-10 ENCOUNTER — Emergency Department (HOSPITAL_COMMUNITY): Payer: Self-pay

## 2012-05-10 ENCOUNTER — Encounter (HOSPITAL_COMMUNITY): Payer: Self-pay | Admitting: Emergency Medicine

## 2012-05-10 DIAGNOSIS — S335XXA Sprain of ligaments of lumbar spine, initial encounter: Secondary | ICD-10-CM | POA: Insufficient documentation

## 2012-05-10 DIAGNOSIS — X58XXXA Exposure to other specified factors, initial encounter: Secondary | ICD-10-CM | POA: Insufficient documentation

## 2012-05-10 DIAGNOSIS — R110 Nausea: Secondary | ICD-10-CM | POA: Insufficient documentation

## 2012-05-10 DIAGNOSIS — E039 Hypothyroidism, unspecified: Secondary | ICD-10-CM | POA: Insufficient documentation

## 2012-05-10 DIAGNOSIS — R42 Dizziness and giddiness: Secondary | ICD-10-CM | POA: Insufficient documentation

## 2012-05-10 DIAGNOSIS — S39012A Strain of muscle, fascia and tendon of lower back, initial encounter: Secondary | ICD-10-CM

## 2012-05-10 DIAGNOSIS — F341 Dysthymic disorder: Secondary | ICD-10-CM | POA: Insufficient documentation

## 2012-05-10 DIAGNOSIS — Z79899 Other long term (current) drug therapy: Secondary | ICD-10-CM | POA: Insufficient documentation

## 2012-05-10 HISTORY — DX: Dizziness and giddiness: R42

## 2012-05-10 LAB — COMPREHENSIVE METABOLIC PANEL
ALT: 18 U/L (ref 0–35)
AST: 18 U/L (ref 0–37)
Albumin: 4.3 g/dL (ref 3.5–5.2)
Alkaline Phosphatase: 69 U/L (ref 39–117)
BUN: 20 mg/dL (ref 6–23)
Chloride: 99 mEq/L (ref 96–112)
Potassium: 4 mEq/L (ref 3.5–5.1)
Total Bilirubin: 0.1 mg/dL — ABNORMAL LOW (ref 0.3–1.2)

## 2012-05-10 LAB — URINALYSIS, ROUTINE W REFLEX MICROSCOPIC
Bilirubin Urine: NEGATIVE
Glucose, UA: NEGATIVE mg/dL
Hgb urine dipstick: NEGATIVE
Ketones, ur: NEGATIVE mg/dL
Protein, ur: NEGATIVE mg/dL

## 2012-05-10 LAB — POCT I-STAT, CHEM 8
BUN: 21 mg/dL (ref 6–23)
Calcium, Ion: 1.3 mmol/L — ABNORMAL HIGH (ref 1.12–1.23)
Glucose, Bld: 93 mg/dL (ref 70–99)
HCT: 41 % (ref 36.0–46.0)
TCO2: 24 mmol/L (ref 0–100)

## 2012-05-10 LAB — GLUCOSE, CAPILLARY: Glucose-Capillary: 94 mg/dL (ref 70–99)

## 2012-05-10 LAB — CBC
MCH: 29.9 pg (ref 26.0–34.0)
Platelets: 360 10*3/uL (ref 150–400)
RBC: 4.38 MIL/uL (ref 3.87–5.11)
WBC: 8.4 10*3/uL (ref 4.0–10.5)

## 2012-05-10 LAB — PROTIME-INR
INR: 0.89 (ref 0.00–1.49)
Prothrombin Time: 12.2 seconds (ref 11.6–15.2)

## 2012-05-10 LAB — RAPID URINE DRUG SCREEN, HOSP PERFORMED
Barbiturates: NOT DETECTED
Opiates: NOT DETECTED
Tetrahydrocannabinol: NOT DETECTED

## 2012-05-10 LAB — URINE MICROSCOPIC-ADD ON

## 2012-05-10 LAB — DIFFERENTIAL
Eosinophils Absolute: 0.3 10*3/uL (ref 0.0–0.7)
Lymphs Abs: 1.9 10*3/uL (ref 0.7–4.0)
Neutrophils Relative %: 65 % (ref 43–77)

## 2012-05-10 LAB — CK TOTAL AND CKMB (NOT AT ARMC)
CK, MB: 2.6 ng/mL (ref 0.3–4.0)
Total CK: 75 U/L (ref 7–177)

## 2012-05-10 LAB — APTT: aPTT: 33 seconds (ref 24–37)

## 2012-05-10 MED ORDER — MECLIZINE HCL 25 MG PO TABS
25.0000 mg | ORAL_TABLET | Freq: Once | ORAL | Status: AC
  Administered 2012-05-10: 25 mg via ORAL
  Filled 2012-05-10: qty 1

## 2012-05-10 MED ORDER — ONDANSETRON HCL 4 MG/2ML IJ SOLN
4.0000 mg | Freq: Once | INTRAMUSCULAR | Status: AC
  Administered 2012-05-10: 4 mg via INTRAVENOUS
  Filled 2012-05-10: qty 2

## 2012-05-10 MED ORDER — SODIUM CHLORIDE 0.9 % IV BOLUS (SEPSIS)
1000.0000 mL | Freq: Once | INTRAVENOUS | Status: AC
  Administered 2012-05-10: 1000 mL via INTRAVENOUS

## 2012-05-10 MED ORDER — PROMETHAZINE HCL 25 MG PO TABS: 25.0000 mg | ORAL_TABLET | Freq: Four times a day (QID) | ORAL | Status: DC | PRN

## 2012-05-10 MED ORDER — LORAZEPAM 2 MG/ML IJ SOLN
1.0000 mg | Freq: Once | INTRAMUSCULAR | Status: AC
  Administered 2012-05-10: 1 mg via INTRAVENOUS
  Filled 2012-05-10: qty 1

## 2012-05-10 MED ORDER — MECLIZINE HCL 25 MG PO TABS: 25.0000 mg | ORAL_TABLET | Freq: Three times a day (TID) | ORAL | Status: AC | PRN

## 2012-05-10 MED ORDER — CYCLOBENZAPRINE HCL 10 MG PO TABS: 5.0000 mg | ORAL_TABLET | Freq: Three times a day (TID) | ORAL | Status: AC | PRN

## 2012-05-10 MED ORDER — IBUPROFEN 800 MG PO TABS: 800.0000 mg | ORAL_TABLET | Freq: Three times a day (TID) | ORAL | Status: AC

## 2012-05-10 MED ORDER — MECLIZINE HCL 25 MG PO TABS
25.0000 mg | ORAL_TABLET | Freq: Once | ORAL | Status: DC
  Filled 2012-05-10: qty 1

## 2012-05-10 MED ORDER — HYDROCODONE-ACETAMINOPHEN 5-500 MG PO TABS: 1.0000 | ORAL_TABLET | Freq: Four times a day (QID) | ORAL | Status: AC | PRN

## 2012-05-10 MED ORDER — GADOBENATE DIMEGLUMINE 529 MG/ML IV SOLN
17.0000 mL | Freq: Once | INTRAVENOUS | Status: AC | PRN
  Administered 2012-05-10: 17 mL via INTRAVENOUS

## 2012-05-10 NOTE — ED Notes (Signed)
CBG 94 

## 2012-05-10 NOTE — ED Notes (Signed)
Pt presenting to ed with c/o vertigo symptoms pt states onset this morning with dizziness and pt states she can not hold her head up. Pt states she also has positive nausea and vomiting and sensitivity to light. Pt states she has had vertigo symptoms previously. Pt is alert and oriented at this time

## 2012-05-10 NOTE — Progress Notes (Signed)
Noted not pcp listed for pt When Cm spoke with her she stated she sees Dr Levert Feinstein EPIC updated

## 2012-05-10 NOTE — ED Provider Notes (Signed)
History     CSN: [id number]  Arrival date & time 05/10/12  1049   First MD Initiated Contact with Patient 05/10/12 1111      Chief Complaint  Patient presents with  . Dizziness    (Consider location/radiation/quality/duration/timing/severity/associated sxs/prior treatment) HPI Patient is a 56 yo female who presents today complaining of acute onset of dizziness which she describes as a spinning sensation.  She has associated nausea with no vomiting.  She reports acute onset of this at 9:00 am and presents at 11:30 AM.  Patient denies headache, numbness, tingling, or paresthesias.  Patient has history of lung cancer and underwent "prophylactic" whole brain radiation fifteen years ago.  She denies congestion or ear pain.  She does have history of vertigo treated with antivert in the distant past.  There are no other associated or modifying factors.   Past Medical History  Diagnosis Date  . 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 Rx Carboplatinum/etoposide X 6, Topotecan X 2, Radiation  . 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 Acquired hypothyroidism 04/24/2012  . 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 H/O chronic ulcerative colitis 04/24/2012    Mild   . Depression with anxiety 04/24/2012  . Vertigo     Past Surgical History  Procedure Date  . Laproscopy   . Abdominal hysterectomy     History reviewed. No pertinent family history.  History  Substance Use Topics  . Smoking status: Never Smoker   . Smokeless tobacco: Not on file  . Alcohol Use: No    OB History    Grav Para Term Preterm Abortions TAB SAB Ect Mult Living                  Review of Systems  Constitutional: Negative.   HENT: Negative.   Eyes: Negative.   Respiratory: Negative.   Cardiovascular: Negative.   Gastrointestinal: Positive for nausea.  Genitourinary: Negative.   Musculoskeletal: Negative.     Skin: Negative.   Neurological: Positive for dizziness.  Hematological: Negative.   Psychiatric/Behavioral: Negative.   All other systems reviewed and are negative.    Allergies  Review of patient's allergies indicates no known allergies.  Home Medications   Current Outpatient Rx  Name Route Sig Dispense Refill  . ALPRAZOLAM 0.5 MG PO TABS Oral Take 0.25 mg by mouth at bedtime as needed. Sleep    . OMEGA-3 FATTY ACIDS 1000 MG PO CAPS Oral Take 1 g by mouth daily.    . IBUPROFEN 200 MG PO TABS Oral Take 600 mg by mouth every 6 (six) hours as needed. Pain    . MODAFINIL 200 MG PO TABS Oral Take 200 mg by mouth daily.    . ADULT MULTIVITAMIN W/MINERALS CH Oral Take 1 tablet by mouth daily.    . VENLAFAXINE HCL ER 150 MG PO CP24 Oral Take 150 mg by mouth daily.    . ALBUTEROL SULFATE HFA 108 (90 BASE) MCG/ACT IN AERS Inhalation Inhale 2 puffs into the lungs every 6 (six) hours as needed for wheezing. Handwritten script given to pt 04/24/12 1 Inhaler PRN    With spacer  . CYCLOBENZAPRINE HCL 10 MG PO TABS Oral Take 0.5 tablets (5 mg total) by mouth 3 (three) times daily as needed for muscle spasms. 30 tablet 0  . HYDROCODONE-ACETAMINOPHEN 5-500 MG PO TABS Oral Take 1-2 tablets by mouth every 6 (six) hours as  needed for pain. 30 tablet 0  . IBUPROFEN 800 MG PO TABS Oral Take 1 tablet (800 mg total) by mouth 3 (three) times daily. 30 tablet 0  . MECLIZINE HCL 25 MG PO TABS Oral Take 1 tablet (25 mg total) by mouth 3 (three) times daily as needed for dizziness. 30 tablet 0  . PROMETHAZINE HCL 25 MG PO TABS Oral Take 1 tablet (25 mg total) by mouth every 6 (six) hours as needed for nausea. 30 tablet 0    BP 130/84  Pulse 92  Temp 98.1 F (36.7 C) (Oral)  Resp 20  SpO2 99%  Physical Exam  Nursing note and vitals reviewed. GEN: Well-developed, well-nourished female in no distress HEENT: Atraumatic, normocephalic. Oropharynx clear without erythema, TM clear BL EYES: PERRLA BL, no scleral  icterus. NECK: Trachea midline, no meningismus CV: regular rate and rhythm. No murmurs, rubs, or gallops PULM: No respiratory distress.  No crackles, wheezes, or rales. GI: soft, non-tender. No guarding, rebound, or tenderness. + bowel sounds  GU: deferred Neuro: cranial nerves grossly 2-12 intact, no abnormalities of strength or sensation, A and O x 3, no pronator drift, no facial droop, no limb ataxia, exacerbation of symptoms with Dix-Hall-Pike MSK: Patient moves all 4 extremities symmetrically, no deformity, edema, or injury noted Skin: No rashes petechiae, purpura, or jaundice Psych: no abnormality of mood   ED Course  Procedures (including critical care time)  Indication: dizziness Please note this EKG was reviewed extemporaneously by myself.   Date: 05/10/2012  Rate: 90  Rhythm: normal sinus rhythm  QRS Axis: normal  Intervals: normal  ST/T Wave abnormalities: normal  Conduction Disutrbances:none  Narrative Interpretation: Q waves in lead III compared to previous  Old EKG Reviewed: changes noted      Labs Reviewed  COMPREHENSIVE METABOLIC PANEL - Abnormal; Notable for the following:    Total Bilirubin 0.1 (*)     All other components within normal limits  URINALYSIS, ROUTINE W REFLEX MICROSCOPIC - Abnormal; Notable for the following:    Leukocytes, UA SMALL (*)     All other components within normal limits  POCT I-STAT, CHEM 8 - Abnormal; Notable for the following:    Calcium, Ion 1.30 (*)     All other components within normal limits  URINE MICROSCOPIC-ADD ON - Abnormal; Notable for the following:    Squamous Epithelial / LPF FEW (*)     All other components within normal limits  PROTIME-INR  APTT  CBC  DIFFERENTIAL  CK TOTAL AND CKMB  TROPONIN I  URINE RAPID DRUG SCREEN (HOSP PERFORMED)  GLUCOSE, CAPILLARY   Ct Head Wo Contrast  05/10/2012  *RADIOLOGY REPORT*  Clinical Data: Dizziness.  Vertigo.  Nausea and vomiting.  CT HEAD WITHOUT CONTRAST  Technique:   Contiguous axial images were obtained from the base of the skull through the vertex without contrast.  Comparison: 05/07/2008  Findings: Indistinct bifrontal regions of white matter hypodensity are slightly more confluent than on the prior exam, for example on images 19-24 of series 2.  The brain stem, cerebellum, cerebral peduncles, thalami, basal ganglia, basilar cisterns, and ventricular system appear normal.  No intracranial hemorrhage, mass lesion, or specific findings for acute CVA. The visualized paranasal sinuses appear clear.  IMPRESSION:  1.  Patchy bifrontal white matter hypodensities are more confluent than on the prior exam.  Given the interval change, further workup with MRI of the brain with without contrast may be warranted to assess for inflammatory or demyelinating process.  Confluent chronic microvascular white matter disease is a differential diagnostic consideration.   Original Report Authenticated By: Dellia Cloud, M.D.    Mr Laqueta Jean Wo Contrast  05/10/2012  *RADIOLOGY REPORT*  Clinical Data: Sudden onset of vertigo and weakness.  Abnormal head CT.  MRI HEAD WITHOUT AND WITH CONTRAST  Technique:  Multiplanar, multiecho pulse sequences of the brain and surrounding structures were obtained according to standard protocol without and with intravenous contrast  Contrast: 17mL MULTIHANCE GADOBENATE DIMEGLUMINE 529 MG/ML IV SOLN  Comparison: Head CT same day.  Head CT 05/07/2008  Findings: Diffusion imaging does not show any acute or subacute infarction.  There is abnormal signal within the pons most consistent with chronic small vessel disease.  Within the cerebral hemispheres, there are foci of abnormal signal in the white matter suggesting chronic small vessel disease.  There are some more confluent subcortical areas of abnormal signal that were visible on CT.  There are scattered punctate foci of hemosiderin deposition throughout the brain.  This constellation of findings raises the  question if the patient had a closed head injury in the past.  No cortical abnormality.  No mass lesion, acute hemorrhage, hydrocephalus or extra-axial collection.  No pituitary mass.  No inflammatory sinus disease.  No skull or skull base lesion. After contrast administration, no abnormal enhancement occurs.  IMPRESSION: No acute finding. Findings typical of chronic small vessel disease affecting the pons and cerebral hemispheric white matter.  Other foci of subcortical white matter signal and scattered areas of hemosiderin deposition raise the possibility of previous closed head injury.  Is there a clinical history of significant closed head injury?   Original Report Authenticated By: Thomasenia Sales, M.D.      1. Vertigo   2. Low back strain       MDM  Patient was evaluated by myself.  Based on symptoms and their timing I did discuss the patient with Dr. Roseanne Reno of neurology who agreed that patient did not need to be a code CVA.  Patient was treated with IVF, zofran and antivert following swallow screen.  CVA work-up was performed in the interim.  Patient had improvement in her symptoms and work-up was concerning for abnormality of CT.  Patient had residual symptoms and MR was performed.  Patient had findings on on MR c/w history of prior brain radiation on MR when discussed with Dr. Karin Golden (reading radiologist).  Patient received a dose of ativan and had resolution of her symptoms.  She was able to ambulate and was given resources for follow-up.  Patient has no PCP.  She also complained of chronic low back strain and reported that she had just been accepted by [facility] when it closed its doors and she still had no PCP.  Patient was prescribed antivert and promethazine (based on cost) for her vertigo.  She was prescribed vicodin, ibuprofen, and flexeril for back strain.  Patient was discharged in good condition.        Cyndra Numbers, MD 05/10/12 2122

## 2012-05-24 ENCOUNTER — Telehealth: Payer: Self-pay | Admitting: *Deleted

## 2012-05-24 NOTE — Telephone Encounter (Signed)
Telephoned patient and left message to return call to [facility] 

## 2012-06-06 ENCOUNTER — Ambulatory Visit (HOSPITAL_COMMUNITY): Payer: Self-pay

## 2012-09-11 ENCOUNTER — Encounter (HOSPITAL_COMMUNITY): Payer: Self-pay | Admitting: Emergency Medicine

## 2012-09-11 ENCOUNTER — Emergency Department (HOSPITAL_COMMUNITY): Payer: Self-pay

## 2012-09-11 ENCOUNTER — Emergency Department (HOSPITAL_COMMUNITY)
Admission: EM | Admit: 2012-09-11 | Discharge: 2012-09-11 | Disposition: A | Discharge status: Home / Self Care | Payer: Self-pay | Attending: Emergency Medicine | Admitting: Emergency Medicine

## 2012-09-11 DIAGNOSIS — Z79899 Other long term (current) drug therapy: Secondary | ICD-10-CM | POA: Insufficient documentation

## 2012-09-11 DIAGNOSIS — E039 Hypothyroidism, unspecified: Secondary | ICD-10-CM | POA: Insufficient documentation

## 2012-09-11 DIAGNOSIS — F411 Generalized anxiety disorder: Secondary | ICD-10-CM | POA: Insufficient documentation

## 2012-09-11 DIAGNOSIS — Z8701 Personal history of pneumonia (recurrent): Secondary | ICD-10-CM | POA: Insufficient documentation

## 2012-09-11 DIAGNOSIS — R059 Cough, unspecified: Secondary | ICD-10-CM | POA: Insufficient documentation

## 2012-09-11 DIAGNOSIS — J40 Bronchitis, not specified as acute or chronic: Secondary | ICD-10-CM | POA: Insufficient documentation

## 2012-09-11 DIAGNOSIS — R05 Cough: Secondary | ICD-10-CM | POA: Insufficient documentation

## 2012-09-11 DIAGNOSIS — F329 Major depressive disorder, single episode, unspecified: Secondary | ICD-10-CM | POA: Insufficient documentation

## 2012-09-11 DIAGNOSIS — F3289 Other specified depressive episodes: Secondary | ICD-10-CM | POA: Insufficient documentation

## 2012-09-11 DIAGNOSIS — J9801 Acute bronchospasm: Secondary | ICD-10-CM | POA: Insufficient documentation

## 2012-09-11 DIAGNOSIS — Z85118 Personal history of other malignant neoplasm of bronchus and lung: Secondary | ICD-10-CM | POA: Insufficient documentation

## 2012-09-11 DIAGNOSIS — C569 Malignant neoplasm of unspecified ovary: Secondary | ICD-10-CM | POA: Insufficient documentation

## 2012-09-11 DIAGNOSIS — Z8719 Personal history of other diseases of the digestive system: Secondary | ICD-10-CM | POA: Insufficient documentation

## 2012-09-11 MED ORDER — ALBUTEROL SULFATE HFA 108 (90 BASE) MCG/ACT IN AERS
2.0000 | INHALATION_SPRAY | RESPIRATORY_TRACT | Status: DC | PRN
  Administered 2012-09-11: 2 via RESPIRATORY_TRACT
  Filled 2012-09-11: qty 6.7

## 2012-09-11 MED ORDER — PREDNISONE 20 MG PO TABS
60.0000 mg | ORAL_TABLET | Freq: Once | ORAL | Status: AC
  Administered 2012-09-11: 60 mg via ORAL
  Filled 2012-09-11: qty 3

## 2012-09-11 MED ORDER — IPRATROPIUM BROMIDE 0.02 % IN SOLN
0.5000 mg | Freq: Once | RESPIRATORY_TRACT | Status: AC
Start: 2012-09-11 — End: 2012-09-11
  Administered 2012-09-11: 0.5 mg via RESPIRATORY_TRACT
  Filled 2012-09-11: qty 2.5

## 2012-09-11 MED ORDER — ALBUTEROL SULFATE (5 MG/ML) 0.5% IN NEBU
5.0000 mg | INHALATION_SOLUTION | Freq: Once | RESPIRATORY_TRACT | Status: AC
  Administered 2012-09-11: 5 mg via RESPIRATORY_TRACT
  Filled 2012-09-11: qty 1

## 2012-09-11 MED ORDER — AMOXICILLIN 500 MG PO CAPS: 500.0000 mg | ORAL_CAPSULE | Freq: Three times a day (TID) | ORAL | Status: DC

## 2012-09-11 MED ORDER — HYDROCOD POLST-CHLORPHEN POLST 10-8 MG/5ML PO LQCR: 5.0000 mL | Freq: Two times a day (BID) | ORAL | Status: DC | PRN

## 2012-09-11 NOTE — ED Notes (Signed)
Pt reports SOB and productive cough x 1 week. Reports pain in back between shoulder blades when she coughs, also reports body aches.

## 2012-09-11 NOTE — ED Provider Notes (Signed)
History     CSN: [id number]  Arrival date & time 09/11/12  1227   First MD Initiated Contact with Patient 09/11/12 1255      Chief Complaint  Patient presents with  . Shortness of Breath  . Cough    (Consider location/radiation/quality/duration/timing/severity/associated sxs/prior treatment) Patient is a 56 y.o. female presenting with shortness of breath and cough. The history is provided by the patient (the pt complains of cough). No language interpreter was used.  Shortness of Breath  The current episode started 2 days ago. The onset was gradual. The problem occurs occasionally. The problem has been unchanged. The problem is moderate. Nothing relieves the symptoms. Nothing aggravates the symptoms. Associated symptoms include cough and shortness of breath. Pertinent negatives include no chest pain.  Cough Associated symptoms include shortness of breath. Pertinent negatives include no chest pain and no headaches.    Past Medical History  Diagnosis Date  . 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 Rx Carboplatinum/etoposide X 6, Topotecan X 2, Radiation  . 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 Acquired hypothyroidism 04/24/2012  . 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 H/O chronic ulcerative colitis 04/24/2012    Mild   . Depression with anxiety 04/24/2012  . Vertigo     Past Surgical History  Procedure Date  . Laproscopy   . Abdominal hysterectomy     History reviewed. No pertinent family history.  History  Substance Use Topics  . Smoking status: Never Smoker   . Smokeless tobacco: Not on file  . Alcohol Use: No    OB History    Grav Para Term Preterm Abortions TAB SAB Ect Mult Living                  Review of Systems  Constitutional: Negative for fatigue.  HENT: Negative for congestion, sinus pressure and ear discharge.   Eyes: Negative for discharge.   Respiratory: Positive for cough and shortness of breath.   Cardiovascular: Negative for chest pain.  Gastrointestinal: Negative for abdominal pain and diarrhea.  Genitourinary: Negative for frequency and hematuria.  Musculoskeletal: Negative for back pain.  Skin: Negative for rash.  Neurological: Negative for seizures and headaches.  Hematological: Negative.   Psychiatric/Behavioral: Negative for hallucinations.    Allergies  Review of patient's allergies indicates no known allergies.  Home Medications   Current Outpatient Rx  Name  Route  Sig  Dispense  Refill  . ALPRAZOLAM 0.5 MG PO TABS   Oral   Take 0.25 mg by mouth at bedtime as needed. Sleep         . OMEGA-3 FATTY ACIDS 1000 MG PO CAPS   Oral   Take 1 g by mouth daily.         . IBUPROFEN 200 MG PO TABS   Oral   Take 600 mg by mouth every 6 (six) hours as needed. Pain         . MODAFINIL 200 MG PO TABS   Oral   Take 200 mg by mouth daily.         . ADULT MULTIVITAMIN W/MINERALS CH   Oral   Take 1 tablet by mouth daily.         . VENLAFAXINE HCL ER 150 MG PO CP24   Oral   Take 150 mg by mouth daily.         . ALBUTEROL SULFATE HFA  108 (90 BASE) MCG/ACT IN AERS   Inhalation   Inhale 2 puffs into the lungs every 6 (six) hours as needed for wheezing. Handwritten script given to pt 04/24/12   1 Inhaler   PRN     With spacer   . AMOXICILLIN 500 MG PO CAPS   Oral   Take 1 capsule (500 mg total) by mouth 3 (three) times daily.   21 capsule   0   . HYDROCOD POLST-CPM POLST ER 10-8 MG/5ML PO LQCR   Oral   Take 5 mLs by mouth every 12 (twelve) hours as needed.   60 mL   0   . PROMETHAZINE HCL 25 MG PO TABS   Oral   Take 1 tablet (25 mg total) by mouth every 6 (six) hours as needed for nausea.   30 tablet   0     BP 111/69  Pulse 107  Temp 98.5 F (36.9 C) (Oral)  Resp 18  SpO2 99%  Physical Exam  Constitutional: She is oriented to person, place, and time. She appears  well-developed.  HENT:  Head: Normocephalic and atraumatic.  Eyes: Conjunctivae normal and EOM are normal. No scleral icterus.  Neck: Neck supple. No thyromegaly present.  Cardiovascular: Normal rate and regular rhythm.  Exam reveals no gallop and no friction rub.   No murmur heard. Pulmonary/Chest: No stridor. She has wheezes. She has no rales. She exhibits no tenderness.  Abdominal: She exhibits no distension. There is no tenderness. There is no rebound.  Musculoskeletal: Normal range of motion. She exhibits no edema.  Lymphadenopathy:    She has no cervical adenopathy.  Neurological: She is oriented to person, place, and time. Coordination normal.  Skin: No rash noted. No erythema.  Psychiatric: She has a normal mood and affect. Her behavior is normal.    ED Course  Procedures (including critical care time)  Labs Reviewed - No data to display Dg Chest 2 View  09/11/2012  *RADIOLOGY REPORT*  Clinical Data: Shortness of breath, cough, upper back pain, history small cell lung cancer 2003  CHEST - 2 VIEW  Comparison: 11/10/2011  Findings: Enlargement of cardiac silhouette. Pulmonary vascularity normal. Enlargement of right hilum little changed since 12/15/2010. No definite acute infiltrate, pleural effusion or pneumothorax. Bones appear diffusely demineralized.  IMPRESSION: Chronic enlargement of right hilum likely related to prior treatment/radiation therapy for lung cancer, little changed. No definite acute abnormalities.   Original Report Authenticated By: Ulyses Southward, M.D.      1. Bronchitis   2. Bronchospasm       MDM          Benny Lennert, MD 09/11/12 1530

## 2012-09-11 NOTE — ED Notes (Signed)
Respiratory notified of duoneb tx.

## 2012-09-15 ENCOUNTER — Telehealth: Payer: Self-pay | Admitting: Oncology

## 2012-09-15 NOTE — Telephone Encounter (Signed)
Called pt and left message regarding appt, that was r/s from 2/18 to 11/20/12 MD , lab will be drawn on 11/17/12

## 2012-10-03 ENCOUNTER — Emergency Department (HOSPITAL_COMMUNITY)
Admission: EM | Admit: 2012-10-03 | Discharge: 2012-10-03 | Disposition: A | Discharge status: Home / Self Care | Payer: Self-pay | Attending: Emergency Medicine | Admitting: Emergency Medicine

## 2012-10-03 ENCOUNTER — Encounter (HOSPITAL_COMMUNITY): Payer: Self-pay | Admitting: *Deleted

## 2012-10-03 ENCOUNTER — Emergency Department (HOSPITAL_COMMUNITY): Payer: Self-pay

## 2012-10-03 DIAGNOSIS — R42 Dizziness and giddiness: Secondary | ICD-10-CM | POA: Insufficient documentation

## 2012-10-03 DIAGNOSIS — Z90710 Acquired absence of both cervix and uterus: Secondary | ICD-10-CM | POA: Insufficient documentation

## 2012-10-03 DIAGNOSIS — E039 Hypothyroidism, unspecified: Secondary | ICD-10-CM | POA: Insufficient documentation

## 2012-10-03 DIAGNOSIS — Z7982 Long term (current) use of aspirin: Secondary | ICD-10-CM | POA: Insufficient documentation

## 2012-10-03 DIAGNOSIS — J40 Bronchitis, not specified as acute or chronic: Secondary | ICD-10-CM | POA: Insufficient documentation

## 2012-10-03 DIAGNOSIS — IMO0002 Reserved for concepts with insufficient information to code with codable children: Secondary | ICD-10-CM | POA: Insufficient documentation

## 2012-10-03 DIAGNOSIS — Z923 Personal history of irradiation: Secondary | ICD-10-CM | POA: Insufficient documentation

## 2012-10-03 DIAGNOSIS — F341 Dysthymic disorder: Secondary | ICD-10-CM | POA: Insufficient documentation

## 2012-10-03 DIAGNOSIS — Z79899 Other long term (current) drug therapy: Secondary | ICD-10-CM | POA: Insufficient documentation

## 2012-10-03 DIAGNOSIS — Z8709 Personal history of other diseases of the respiratory system: Secondary | ICD-10-CM | POA: Insufficient documentation

## 2012-10-03 DIAGNOSIS — Z8719 Personal history of other diseases of the digestive system: Secondary | ICD-10-CM | POA: Insufficient documentation

## 2012-10-03 DIAGNOSIS — R6883 Chills (without fever): Secondary | ICD-10-CM | POA: Insufficient documentation

## 2012-10-03 DIAGNOSIS — Z85118 Personal history of other malignant neoplasm of bronchus and lung: Secondary | ICD-10-CM | POA: Insufficient documentation

## 2012-10-03 DIAGNOSIS — R0602 Shortness of breath: Secondary | ICD-10-CM | POA: Insufficient documentation

## 2012-10-03 DIAGNOSIS — M549 Dorsalgia, unspecified: Secondary | ICD-10-CM | POA: Insufficient documentation

## 2012-10-03 MED ORDER — ALBUTEROL SULFATE (5 MG/ML) 0.5% IN NEBU
2.5000 mg | INHALATION_SOLUTION | RESPIRATORY_TRACT | Status: DC
  Administered 2012-10-03: 2.5 mg via RESPIRATORY_TRACT
  Filled 2012-10-03: qty 0.5

## 2012-10-03 MED ORDER — AZITHROMYCIN 250 MG PO TABS: 250.0000 mg | ORAL_TABLET | Freq: Every day | ORAL | Status: DC

## 2012-10-03 MED ORDER — ALBUTEROL SULFATE HFA 108 (90 BASE) MCG/ACT IN AERS: 2.0000 | INHALATION_SPRAY | RESPIRATORY_TRACT | Status: DC | PRN

## 2012-10-03 MED ORDER — IPRATROPIUM BROMIDE 0.02 % IN SOLN
0.5000 mg | RESPIRATORY_TRACT | Status: DC
  Administered 2012-10-03: 0.5 mg via RESPIRATORY_TRACT
  Filled 2012-10-03: qty 2.5

## 2012-10-03 MED ORDER — HYDROCOD POLST-CHLORPHEN POLST 10-8 MG/5ML PO LQCR: 5.0000 mL | Freq: Two times a day (BID) | ORAL | Status: DC | PRN

## 2012-10-03 MED ORDER — BENZONATATE 100 MG PO CAPS: 100.0000 mg | ORAL_CAPSULE | Freq: Three times a day (TID) | ORAL | Status: DC

## 2012-10-03 NOTE — ED Provider Notes (Signed)
Medical screening examination/treatment/procedure(s) were performed by non-physician practitioner and as supervising physician I was immediately available for consultation/collaboration.   Flint Melter, MD 10/03/12 [phone]

## 2012-10-03 NOTE — ED Notes (Signed)
RT at bedside for breathing tx.

## 2012-10-03 NOTE — ED Notes (Signed)
RT called for breathing tx. 

## 2012-10-03 NOTE — ED Provider Notes (Signed)
History  Scribed for Johnnette Gourd, PA-C/ Flint Melter, MD, the patient was seen in room WTR5/WTR5. This chart was scribed by Candelaria Stagers. The patient's care started at 7:26 PM   CSN: [id number]  Arrival date & time 10/03/12  1616   First MD Initiated Contact with Patient 10/03/12 1925      Chief Complaint  Patient presents with  . Cough  . Bronchitis  . Back Pain     Patient is a 57 y.o. female presenting with back pain. The history is provided by the patient. No language interpreter was used.  Back Pain  Pertinent negatives include no fever and no weakness.   BRYNDA HEICK is a 57 y.o. female who presents to the Emergency Department complaining of persistent productive cough and SOB.  Pt was diagnosed with bronchitis about one month ago and was prescribed amoxicillin.  She reports that she felt better for about three days and the sx returned.  She is now experiencing back pain and chills.  She denies fever, nausea, or vomiting.  She does not use breathing treatments at home.  She has used an inhaler with some relief.  Pt has a h/o lung cancer.   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  Pt does not smoke.    Past Medical History  Diagnosis Date  . 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 Rx Carboplatinum/etoposide X 6, Topotecan X 2, Radiation  . 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 Acquired hypothyroidism 04/24/2012  . 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 H/O chronic ulcerative colitis 04/24/2012    Mild   . Depression with anxiety 04/24/2012  . Vertigo     Past Surgical History  Procedure Date  . Laproscopy   . Abdominal hysterectomy     History reviewed. No pertinent family history.  History  Substance Use Topics  . Smoking status: Never Smoker   . Smokeless tobacco: Not on file  . Alcohol Use: No    OB History    Grav Para Term Preterm  Abortions TAB SAB Ect Mult Living                  Review of Systems  Constitutional: Positive for chills. Negative for fever.  Respiratory: Positive for cough and shortness of breath.   Gastrointestinal: Negative for nausea and vomiting.  Musculoskeletal: Positive for back pain.  Neurological: Negative for weakness.    Allergies  Review of patient's allergies indicates no known allergies.  Home Medications   Current Outpatient Rx  Name  Route  Sig  Dispense  Refill  . ALBUTEROL SULFATE HFA 108 (90 BASE) MCG/ACT IN AERS   Inhalation   Inhale 2 puffs into the lungs every 6 (six) hours as needed for wheezing. Handwritten script given to pt 04/24/12   1 Inhaler   PRN     With spacer   . ALPRAZOLAM 0.5 MG PO TABS   Oral   Take 0.25 mg by mouth at bedtime as needed. Sleep         . ASPIRIN EC 81 MG PO TBEC   Oral   Take 486 mg by mouth once.         . OMEGA-3 FATTY ACIDS 1000 MG PO CAPS   Oral   Take 1 g by mouth daily.         . IBUPROFEN 200 MG  PO TABS   Oral   Take 600 mg by mouth every 8 (eight) hours as needed. Pain         . MODAFINIL 200 MG PO TABS   Oral   Take 200 mg by mouth daily.         . ADULT MULTIVITAMIN W/MINERALS CH   Oral   Take 1 tablet by mouth daily.         . NYQUIL PO   Oral   Take 30 mLs by mouth at bedtime as needed. For sleep.         . VENLAFAXINE HCL ER 150 MG PO CP24   Oral   Take 150 mg by mouth daily.           BP 114/69  Pulse 101  Temp 98.5 F (36.9 C) (Oral)  Resp 20  SpO2 98%  Physical Exam  Nursing note and vitals reviewed. Constitutional: She is oriented to person, place, and time. She appears well-developed and well-nourished. No distress.  HENT:  Head: Normocephalic and atraumatic.  Eyes: EOM are normal.  Neck: Normal range of motion. Neck supple. No tracheal deviation present.  Cardiovascular: Regular rhythm and normal heart sounds.  Tachycardia present.   Pulmonary/Chest: Effort normal. No  respiratory distress.       Scattered rhonchi cleared with cough.  Harsh cough.   Abdominal: Soft. Bowel sounds are normal.  Musculoskeletal: Normal range of motion.  Neurological: She is alert and oriented to person, place, and time.  Skin: Skin is warm and dry.  Psychiatric: She has a normal mood and affect. Her behavior is normal.    ED Course  Procedures   DIAGNOSTIC STUDIES: Oxygen Saturation is 98% on room air, normal by my interpretation.    COORDINATION OF CARE:  5:04PM Ordered: DG Chest 2 View  7:44 PM Will prescribe Zithromax, cough medicine,  and give a breathing treatment in the ED.  Pt understands and agrees.    Labs Reviewed - No data to display Dg Chest 2 View  10/03/2012  *RADIOLOGY REPORT*  Clinical Data: Cough, bronchitis, history of lung cancer and right hilar radiation  CHEST - 2 VIEW  Comparison: 07/04/2011, 09/11/2012  Findings: Stable chronic right hilar opacity compatible with underlying radiation changes.  Stable heart size and vascularity. Slightly better lung volumes than the prior study.  No definite superimposed edema, focal pneumonia, collapse, consolidation, effusion or pneumothorax.  Trachea is midline.  IMPRESSION: Stable right perihilar opacity related to prior radiation changes. Better demonstrated by prior CT.  No superimposed acute process.   Original Report Authenticated By: Judie Petit. Shick, M.D.      1. Bronchitis       MDM  57 y/o female with bronchitis. Symptoms improved after receiving breathing tx ED. Scattered ronchi on exam cleared with coughing. Rx azithromycin. Previously on amoxil with returning symptoms. Rx albuterol and cough medicine. Return precautions discussed. No change on CXR. Patient is in NAD. Stable for discharge. Patient states understanding of plan and is agreeable.   I personally performed the services described in this documentation, which was scribed in my presence. The recorded information has been reviewed and is  accurate.        Trevor Mace, PA-C 10/03/12 2026

## 2012-10-03 NOTE — ED Notes (Signed)
Pt states on the 23rd came to ED and was diagnosed w/ bronchitis sent home w/ amoxicillin and tussinex, did not help, still coughing, shortness of breath, yellow/greenish mucus coughing up, severe back pain has now started, pt states feels like she has broke her back.

## 2012-10-03 NOTE — ED Notes (Addendum)
Pt ambulatory to exam room with steady gait. Pt states she gets "winded" easily while walking.

## 2012-11-03 ENCOUNTER — Telehealth: Payer: Self-pay | Admitting: Oncology

## 2012-11-03 NOTE — Telephone Encounter (Signed)
Called pt and left message regarding MD visit on 3/3

## 2012-11-07 ENCOUNTER — Other Ambulatory Visit: Payer: Self-pay | Admitting: Lab

## 2012-11-07 ENCOUNTER — Ambulatory Visit: Payer: Self-pay | Admitting: Oncology

## 2012-11-17 ENCOUNTER — Other Ambulatory Visit (HOSPITAL_BASED_OUTPATIENT_CLINIC_OR_DEPARTMENT_OTHER): Payer: Self-pay

## 2012-11-17 ENCOUNTER — Other Ambulatory Visit: Payer: Self-pay | Admitting: Oncology

## 2012-11-17 DIAGNOSIS — C3491 Malignant neoplasm of unspecified part of right bronchus or lung: Secondary | ICD-10-CM

## 2012-11-17 DIAGNOSIS — C3490 Malignant neoplasm of unspecified part of unspecified bronchus or lung: Secondary | ICD-10-CM

## 2012-11-17 LAB — CBC WITH DIFFERENTIAL/PLATELET
Basophils Absolute: 0 10*3/uL (ref 0.0–0.1)
Eosinophils Absolute: 0.4 10*3/uL (ref 0.0–0.5)
HGB: 12.1 g/dL (ref 11.6–15.9)
MCV: 87 fL (ref 79.5–101.0)
MONO#: 0.8 10*3/uL (ref 0.1–0.9)
MONO%: 6.7 % (ref 0.0–14.0)
NEUT#: 8.7 10*3/uL — ABNORMAL HIGH (ref 1.5–6.5)
RBC: 4.18 10*6/uL (ref 3.70–5.45)
RDW: 14.8 % — ABNORMAL HIGH (ref 11.2–14.5)
WBC: 11.3 10*3/uL — ABNORMAL HIGH (ref 3.9–10.3)

## 2012-11-17 LAB — COMPREHENSIVE METABOLIC PANEL (CC13)
Albumin: 3.7 g/dL (ref 3.5–5.0)
BUN: 19.9 mg/dL (ref 7.0–26.0)
CO2: 24 mEq/L (ref 22–29)
Calcium: 9.3 mg/dL (ref 8.4–10.4)
Chloride: 104 mEq/L (ref 98–107)
Glucose: 95 mg/dl (ref 70–99)
Potassium: 3.8 mEq/L (ref 3.5–5.1)
Sodium: 138 mEq/L (ref 136–145)
Total Protein: 8 g/dL (ref 6.4–8.3)

## 2012-11-20 ENCOUNTER — Ambulatory Visit (HOSPITAL_BASED_OUTPATIENT_CLINIC_OR_DEPARTMENT_OTHER): Payer: Self-pay | Admitting: Oncology

## 2012-11-20 ENCOUNTER — Telehealth: Payer: Self-pay | Admitting: Oncology

## 2012-11-20 VITALS — BP 119/81 | HR 98 | Temp 98.1°F | Resp 20 | Ht 65.0 in | Wt 193.7 lb

## 2012-11-20 DIAGNOSIS — F341 Dysthymic disorder: Secondary | ICD-10-CM

## 2012-11-20 DIAGNOSIS — Z85118 Personal history of other malignant neoplasm of bronchus and lung: Secondary | ICD-10-CM

## 2012-11-20 DIAGNOSIS — C3491 Malignant neoplasm of unspecified part of right bronchus or lung: Secondary | ICD-10-CM

## 2012-11-20 DIAGNOSIS — F418 Other specified anxiety disorders: Secondary | ICD-10-CM

## 2012-11-20 DIAGNOSIS — J40 Bronchitis, not specified as acute or chronic: Secondary | ICD-10-CM

## 2012-11-20 DIAGNOSIS — D271 Benign neoplasm of left ovary: Secondary | ICD-10-CM

## 2012-11-20 NOTE — Progress Notes (Signed)
Hematology and Oncology Follow Up Visit  Stacy [name] [id number] [birth year] 57 y.o. 11/20/2012 6:18 PM   Principle Diagnosis: Encounter Diagnoses  Name Primary?  . Depression with anxiety   . Small cell lung cancer, right Yes  . Serous cystadenoma of ovary, left      Interim History:   Followup visit for this 57 year old woman who presented with an enlarged right supraclavicular lymph node in May of 2000 as the first sign of limited stage small cell lung cancer. She was treated very aggressively with chemotherapy and radiation therapy. Other than developing radiation pneumonitis, which has left some chronic scarring in the right hilar area on chest radiographic and CT, she suffered no major complications from treatment and remains in a remission at this time, out an amazing 14. years.   She remains chronically depressed over her life situation. 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 She had to leave her job when she got her lung cancer. She has never been able to find reasonable employment since then. She went through a divorce. She had to raise 2 boys. Her mother was chronically ill with obstructive airway disease and died about 2 years ago. 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 She does not have consistent primary care medical attention. She has not had a mammogram in 3 years. She tried to get established with [facility] and they temporarily stopped seeing patients.  She has a persistent bronchitis which has been going on now for over a month. She had a emergency room visit. A chest radiograph done on January 14 which I personally reviewed, did not show any acute infiltrates. There are chronic changes from previous radiation treatments. No evidence for recurrent cancer. Most recent CT scan of the chest was done in 07/04/2011 and was also negative for any  recurrent cancer.    Medications: reviewed  Allergies: No Known Allergies  Review of Systems: Constitutional:   Chronic fatigue related to stress Respiratory: Persistent bronchitis with associated wheezing Cardiovascular:  No chest pain or palpitations Gastrointestinal: No abdominal pain or change in bowel habit Genito-Urinary: Post menopausal, status post partial hysterectomy Musculoskeletal: No arthritis complaints Neurologic: No headache or change in vision Skin: No rash or ecchymoses Remaining ROS negative.  Physical Exam: Blood pressure 119/81, pulse 98, temperature 98.1 F (36.7 C), temperature source Oral, resp. rate 20, height 5\' 5"  (1.651 m), weight 193 lb 11.2 oz (87.862 kg). Wt Readings from Last 3 Encounters:  11/20/12 193 lb 11.2 oz (87.862 kg)  04/24/12 188 lb 14.4 oz (85.684 kg)     General appearance: Well-nourished Caucasian woman HENNT: Pharynx no erythema or exudate Lymph nodes: No adenopathy Breasts: Lungs: Clear to auscultation resonant to percussion Heart: Regular rhythm no murmur Abdomen: Soft, nontender, no mass, no organomegaly Extremities: No edema, no calf tenderness Vascular: No cyanosis Neurologic: No focal deficit Skin: No rash  Lab Results: Lab Results  Component Value Date   WBC 11.3* 11/17/2012   HGB 12.1 11/17/2012   HCT 36.4 11/17/2012   MCV 87.0 11/17/2012   PLT 356 11/17/2012     Chemistry      Component Value Date/Time   NA 138 11/17/2012 1235   NA 138 05/10/2012 1147   K 3.8 11/17/2012 1235   K 4.1 05/10/2012 1147   CL 104 11/17/2012 1235   CL 105 05/10/2012 1147  CO2 24 11/17/2012 1235   CO2 24 05/10/2012 1118   BUN 19.9 11/17/2012 1235   BUN 21 05/10/2012 1147   CREATININE 0.7 11/17/2012 1235   CREATININE 0.70 05/10/2012 1147      Component Value Date/Time   CALCIUM 9.3 11/17/2012 1235   CALCIUM 10.3 05/10/2012 1118   ALKPHOS 79 11/17/2012 1235   ALKPHOS 69 05/10/2012 1118   AST 17 11/17/2012 1235   AST 18 05/10/2012 1118   ALT  17 11/17/2012 1235   ALT 18 05/10/2012 1118   BILITOT 0.21 11/17/2012 1235   BILITOT 0.1* 05/10/2012 1118       Radiological Studies: No results found.  Impression and Plan:  #1. Limited stage small cell lung cancer treated with radiation and surgery. She is now out almost 14 years from initial diagnosis and likely cured.  Plan: Continue annual chest x-ray and clinical exam.  #2. History of right salpingo-oophorectomy October 2006 for a benign serous cystadenoma.  #3. History of low-grade ulcerative colitis.  #4. History of of nonspecific gastroenteritis.  #5. 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  #6. Right breast atrophy. Possible complication of previous chest radiation. Last mammogram done November 2011. She has limited financial resources which has been an obstacle in her health maintenance exams.  #7. Intermittent wheezing and dyspnea on exertion. Persistent bronchitis. I am prescribing an albuterol inhaler 2 puffs every 6 hours when necessary and Advair steroid spray twice daily #8. Chronic anxiety and depression.  #9. Hypothyroid likely iatrogenic from previous radiation. previously on replacement with 75 mcg of Synthroid.    CC:. Dr. Jetty Duhamel; Dr. Leary Roca; Dr. Chipper Herb     Levert Feinstein, MD 3/3/20146:18 PM

## 2012-11-20 NOTE — Telephone Encounter (Signed)
Gave pt apt for lab and MD for MArch 2014, reminded pt of chest xray

## 2013-01-10 ENCOUNTER — Other Ambulatory Visit: Payer: Self-pay | Admitting: *Deleted

## 2013-01-10 DIAGNOSIS — C3490 Malignant neoplasm of unspecified part of unspecified bronchus or lung: Secondary | ICD-10-CM

## 2013-01-10 MED ORDER — FLUTICASONE PROPIONATE (INHAL) 50 MCG/BLIST IN AEPB: 1.0000 | INHALATION_SPRAY | Freq: Two times a day (BID) | RESPIRATORY_TRACT | Status: DC

## 2013-01-10 MED ORDER — ALBUTEROL SULFATE HFA 108 (90 BASE) MCG/ACT IN AERS: INHALATION_SPRAY | RESPIRATORY_TRACT | Status: DC

## 2013-01-16 ENCOUNTER — Encounter (HOSPITAL_COMMUNITY): Payer: Self-pay | Admitting: Emergency Medicine

## 2013-01-16 ENCOUNTER — Emergency Department (HOSPITAL_COMMUNITY)
Admission: EM | Admit: 2013-01-16 | Discharge: 2013-01-16 | Disposition: A | Discharge status: Home / Self Care | Payer: No Typology Code available for payment source | Attending: Emergency Medicine | Admitting: Emergency Medicine

## 2013-01-16 ENCOUNTER — Emergency Department (HOSPITAL_COMMUNITY): Payer: No Typology Code available for payment source

## 2013-01-16 DIAGNOSIS — Y9389 Activity, other specified: Secondary | ICD-10-CM | POA: Insufficient documentation

## 2013-01-16 DIAGNOSIS — Z8742 Personal history of other diseases of the female genital tract: Secondary | ICD-10-CM | POA: Insufficient documentation

## 2013-01-16 DIAGNOSIS — Z8719 Personal history of other diseases of the digestive system: Secondary | ICD-10-CM | POA: Insufficient documentation

## 2013-01-16 DIAGNOSIS — Y92410 Unspecified street and highway as the place of occurrence of the external cause: Secondary | ICD-10-CM | POA: Insufficient documentation

## 2013-01-16 DIAGNOSIS — Z79899 Other long term (current) drug therapy: Secondary | ICD-10-CM | POA: Insufficient documentation

## 2013-01-16 DIAGNOSIS — Z85118 Personal history of other malignant neoplasm of bronchus and lung: Secondary | ICD-10-CM | POA: Insufficient documentation

## 2013-01-16 DIAGNOSIS — S139XXA Sprain of joints and ligaments of unspecified parts of neck, initial encounter: Secondary | ICD-10-CM | POA: Insufficient documentation

## 2013-01-16 DIAGNOSIS — S161XXA Strain of muscle, fascia and tendon at neck level, initial encounter: Secondary | ICD-10-CM

## 2013-01-16 DIAGNOSIS — F341 Dysthymic disorder: Secondary | ICD-10-CM | POA: Insufficient documentation

## 2013-01-16 DIAGNOSIS — Z7982 Long term (current) use of aspirin: Secondary | ICD-10-CM | POA: Insufficient documentation

## 2013-01-16 DIAGNOSIS — M79642 Pain in left hand: Secondary | ICD-10-CM

## 2013-01-16 DIAGNOSIS — Z8709 Personal history of other diseases of the respiratory system: Secondary | ICD-10-CM | POA: Insufficient documentation

## 2013-01-16 DIAGNOSIS — IMO0002 Reserved for concepts with insufficient information to code with codable children: Secondary | ICD-10-CM | POA: Insufficient documentation

## 2013-01-16 DIAGNOSIS — M545 Low back pain: Secondary | ICD-10-CM

## 2013-01-16 MED ORDER — DIAZEPAM 5 MG PO TABS
10.0000 mg | ORAL_TABLET | Freq: Once | ORAL | Status: AC
  Administered 2013-01-16: 10 mg via ORAL
  Filled 2013-01-16: qty 2

## 2013-01-16 MED ORDER — NAPROXEN 500 MG PO TABS: 500.0000 mg | ORAL_TABLET | Freq: Two times a day (BID) | ORAL | Status: DC | PRN

## 2013-01-16 MED ORDER — HYDROCODONE-ACETAMINOPHEN 5-325 MG PO TABS
2.0000 | ORAL_TABLET | Freq: Once | ORAL | Status: AC
  Administered 2013-01-16: 2 via ORAL
  Filled 2013-01-16: qty 2

## 2013-01-16 MED ORDER — HYDROCODONE-IBUPROFEN 7.5-200 MG PO TABS: 1.0000 | ORAL_TABLET | Freq: Four times a day (QID) | ORAL | Status: DC | PRN

## 2013-01-16 MED ORDER — METHOCARBAMOL 750 MG PO TABS: 750.0000 mg | ORAL_TABLET | Freq: Four times a day (QID) | ORAL | Status: DC | PRN
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
## 2013-01-16 NOTE — ED Notes (Signed)
Pt's brother on his way for transport
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
## 2013-01-16 NOTE — ED Provider Notes (Signed)
Medical screening examination/treatment/procedure(s) were performed by non-physician practitioner and as supervising physician I was immediately available for consultation/collaboration.  Donnetta Hutching, MD 01/16/13 1230

## 2013-01-22 ENCOUNTER — Emergency Department (HOSPITAL_COMMUNITY)
Admission: EM | Admit: 2013-01-22 | Discharge: 2013-01-22 | Disposition: A | Discharge status: Home / Self Care | Payer: No Typology Code available for payment source | Attending: Emergency Medicine | Admitting: Emergency Medicine

## 2013-01-22 ENCOUNTER — Encounter (HOSPITAL_COMMUNITY): Payer: Self-pay | Admitting: Emergency Medicine

## 2013-01-22 DIAGNOSIS — S335XXA Sprain of ligaments of lumbar spine, initial encounter: Secondary | ICD-10-CM | POA: Insufficient documentation

## 2013-01-22 DIAGNOSIS — R209 Unspecified disturbances of skin sensation: Secondary | ICD-10-CM | POA: Insufficient documentation

## 2013-01-22 DIAGNOSIS — Z8709 Personal history of other diseases of the respiratory system: Secondary | ICD-10-CM | POA: Insufficient documentation

## 2013-01-22 DIAGNOSIS — Z85118 Personal history of other malignant neoplasm of bronchus and lung: Secondary | ICD-10-CM | POA: Insufficient documentation

## 2013-01-22 DIAGNOSIS — Z923 Personal history of irradiation: Secondary | ICD-10-CM | POA: Insufficient documentation

## 2013-01-22 DIAGNOSIS — Y9389 Activity, other specified: Secondary | ICD-10-CM | POA: Insufficient documentation

## 2013-01-22 DIAGNOSIS — S6392XD Sprain of unspecified part of left wrist and hand, subsequent encounter: Secondary | ICD-10-CM

## 2013-01-22 DIAGNOSIS — F341 Dysthymic disorder: Secondary | ICD-10-CM | POA: Insufficient documentation

## 2013-01-22 DIAGNOSIS — S6390XA Sprain of unspecified part of unspecified wrist and hand, initial encounter: Secondary | ICD-10-CM | POA: Insufficient documentation

## 2013-01-22 DIAGNOSIS — Z8742 Personal history of other diseases of the female genital tract: Secondary | ICD-10-CM | POA: Insufficient documentation

## 2013-01-22 DIAGNOSIS — Z7982 Long term (current) use of aspirin: Secondary | ICD-10-CM | POA: Insufficient documentation

## 2013-01-22 DIAGNOSIS — S39012D Strain of muscle, fascia and tendon of lower back, subsequent encounter: Secondary | ICD-10-CM

## 2013-01-22 DIAGNOSIS — Z79899 Other long term (current) drug therapy: Secondary | ICD-10-CM | POA: Insufficient documentation

## 2013-01-22 DIAGNOSIS — Z8669 Personal history of other diseases of the nervous system and sense organs: Secondary | ICD-10-CM | POA: Insufficient documentation

## 2013-01-22 DIAGNOSIS — Y92410 Unspecified street and highway as the place of occurrence of the external cause: Secondary | ICD-10-CM | POA: Insufficient documentation

## 2013-01-22 DIAGNOSIS — Z8719 Personal history of other diseases of the digestive system: Secondary | ICD-10-CM | POA: Insufficient documentation

## 2013-01-22 MED ORDER — HYDROCODONE-IBUPROFEN 7.5-200 MG PO TABS: 1.0000 | ORAL_TABLET | Freq: Four times a day (QID) | ORAL | Status: DC | PRN
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
## 2013-01-24 NOTE — ED Provider Notes (Signed)
Medical screening examination/treatment/procedure(s) were performed by non-physician practitioner and as supervising physician I was immediately available for consultation/collaboration.  Mahlon Gabrielle, MD 01/24/13 0056 

## 2013-03-30 ENCOUNTER — Other Ambulatory Visit (HOSPITAL_COMMUNITY)
Admission: RE | Admit: 2013-03-30 | Discharge: 2013-03-30 | Disposition: A | Discharge status: Home / Self Care | Payer: No Typology Code available for payment source | Source: Ambulatory Visit | Attending: Family Medicine | Admitting: Family Medicine

## 2013-03-30 ENCOUNTER — Ambulatory Visit: Payer: No Typology Code available for payment source | Attending: Family Medicine | Admitting: Family Medicine

## 2013-03-30 VITALS — BP 115/78 | HR 92 | Temp 98.9°F | Resp 16 | Ht 63.75 in | Wt 196.0 lb

## 2013-03-30 DIAGNOSIS — E039 Hypothyroidism, unspecified: Secondary | ICD-10-CM

## 2013-03-30 DIAGNOSIS — Z85118 Personal history of other malignant neoplasm of bronchus and lung: Secondary | ICD-10-CM

## 2013-03-30 DIAGNOSIS — R5381 Other malaise: Secondary | ICD-10-CM

## 2013-03-30 DIAGNOSIS — R5383 Other fatigue: Secondary | ICD-10-CM | POA: Insufficient documentation

## 2013-03-30 DIAGNOSIS — Z1151 Encounter for screening for human papillomavirus (HPV): Secondary | ICD-10-CM | POA: Insufficient documentation

## 2013-03-30 DIAGNOSIS — R5382 Chronic fatigue, unspecified: Secondary | ICD-10-CM

## 2013-03-30 DIAGNOSIS — Z01419 Encounter for gynecological examination (general) (routine) without abnormal findings: Secondary | ICD-10-CM | POA: Insufficient documentation

## 2013-03-30 DIAGNOSIS — Z0000 Encounter for general adult medical examination without abnormal findings: Secondary | ICD-10-CM

## 2013-03-30 LAB — CBC
MCH: 28.8 pg (ref 26.0–34.0)
MCV: 85.4 fL (ref 78.0–100.0)
Platelets: 335 10*3/uL (ref 150–400)
RDW: 14.6 % (ref 11.5–15.5)

## 2013-03-30 MED ORDER — MODAFINIL 200 MG PO TABS: 200.0000 mg | ORAL_TABLET | Freq: Every day | ORAL | Status: DC

## 2013-03-30 NOTE — Progress Notes (Signed)
Patient ID: Stacy [name], female   DOB: [birth year], 57 y.o.   MRN: [id number]  [id number] examination.  HPI: Pt says that she needs her annual PAP exam.  The patient reports that she has had a hysterectomy.   Pt has a history of small cell lung cancer. Patient reports that she has been taking thyroid medication in the past but never officially diagnosed as being hypothyroid.  She has had brain radiation.  Her doctor in the past had put her on thyroid medicine to assist her with the tablets and chronic fatigue issues.  She reports that she felt better when she was taking it.  She had no weight loss with the medication.   No Known Allergies Past Medical History  Diagnosis Date  . 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 Rx Carboplatinum/etoposide X 6, Topotecan X 2, Radiation  . 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 Acquired hypothyroidism 04/24/2012  . 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 H/O chronic ulcerative colitis 04/24/2012    Mild   . Depression with anxiety 04/24/2012  . Vertigo   . Anxiety    Current Outpatient Prescriptions on File Prior to Visit  Medication Sig Dispense Refill  . aspirin EC 81 MG tablet Take 81 mg by mouth daily.       . cholecalciferol (VITAMIN D) 1000 UNITS tablet Take 1,000 Units by mouth daily.      . fish oil-omega-3 fatty acids 1000 MG capsule Take 1 g by mouth daily.      . naproxen (NAPROSYN) 500 MG tablet Take 1 tablet (500 mg total) by mouth 2 (two) times daily as needed.  30 tablet  0  . venlafaxine XR (EFFEXOR-XR) 150 MG 24 hr capsule Take 150 mg by mouth daily.      Marland Kitchen HYDROcodone-ibuprofen (VICOPROFEN) 7.5-200 MG per tablet Take 1 tablet by mouth every 6 (six) hours as needed for pain.  10 tablet  0  . HYDROcodone-ibuprofen (VICOPROFEN) 7.5-200 MG per tablet Take 1 tablet by mouth every 6 (six) hours as needed for pain.  20 tablet  0  .  methocarbamol (ROBAXIN) 750 MG tablet Take 1 tablet (750 mg total) by mouth 4 (four) times daily as needed (Take 1 tablet every 6 hours as needed for muscle spasms.).  20 tablet  0  . Multiple Vitamin (MULTIVITAMIN WITH MINERALS) TABS Take 1 tablet by mouth daily.      . simethicone (MYLICON) 125 MG chewable tablet Chew 125 mg by mouth every 6 (six) hours as needed for flatulence.       No current facility-administered medications on file prior to visit.   History reviewed. No pertinent family history. History   Social History  . Marital Status: Divorced    Spouse Name: N/A    Number of Children: N/A  . Years of Education: N/A   Occupational History  . Not on file.   Social History Main Topics  . Smoking status: Never Smoker   . Smokeless tobacco: Never Used  . Alcohol Use: No  . Drug Use: No  . Sexually Active: Not on file   Other Topics Concern  . Not on file   Social History Narrative  . No narrative on file    Review of Systems  Constitutional: Negative for fever, chills, diaphoresis, activity change, appetite change and fatigue.  HENT: Negative for ear pain, nosebleeds, congestion, facial swelling, rhinorrhea,  neck pain, neck stiffness and ear discharge.   Eyes: Negative for pain, discharge, redness, itching and visual disturbance.  Respiratory: Negative for cough, choking, chest tightness, shortness of breath, wheezing and stridor.   Cardiovascular: Negative for chest pain, palpitations and leg swelling.  Gastrointestinal: Negative for abdominal distention.  Genitourinary: Negative for dysuria, urgency, frequency, hematuria, flank pain, decreased urine volume, difficulty urinating and dyspareunia.  Musculoskeletal: Negative for back pain, joint swelling, arthralgias and gait problem.  Neurological: Negative for dizziness, tremors, seizures, syncope, facial asymmetry, speech difficulty, weakness, light-headedness, numbness and headaches.  Hematological: Negative for  adenopathy. Does not bruise/bleed easily.  Psychiatric/Behavioral: Negative for hallucinations, behavioral problems, confusion, dysphoric mood, decreased concentration and agitation.    Objective:   Filed Vitals:   03/30/13 1733  BP: 115/78  Pulse: 92  Temp: 98.9 F (37.2 C)  Resp: 16    Physical Exam  Constitutional: Appears well-developed and well-nourished. No distress.  HENT: Normocephalic. External right and left ear normal. Oropharynx is clear and moist.  Eyes: Conjunctivae and EOM are normal. PERRLA, no scleral icterus.  Neck: Normal ROM. Neck supple. No JVD. No tracheal deviation. No thyromegaly.  CVS: RRR, S1/S2 +, no murmurs, no gallops, no carotid bruit.  Pulmonary: Effort and breath sounds normal, no stridor, rhonchi, wheezes, rales.  Abdominal: Soft. BS +,  no distension, tenderness, rebound or guarding.  Musculoskeletal: Normal range of motion. No edema and no tenderness.  Lymphadenopathy: No lymphadenopathy noted, cervical, inguinal. Neuro: Alert. Normal reflexes, muscle tone coordination. No cranial nerve deficit. Skin: Skin is warm and dry. No rash noted. Not diaphoretic. No erythema. No pallor.  Psychiatric: Normal mood and affect. Behavior, judgment, thought content normal.   Lab Results  Component Value Date   WBC 11.3* 11/17/2012   HGB 12.1 11/17/2012   HCT 36.4 11/17/2012   MCV 87.0 11/17/2012   PLT 356 11/17/2012   Lab Results  Component Value Date   CREATININE 0.7 11/17/2012   BUN 19.9 11/17/2012   NA 138 11/17/2012   K 3.8 11/17/2012   CL 104 11/17/2012   CO2 24 11/17/2012    No results found for this basename: HGBA1C   Lipid Panel     Component Value Date/Time   CHOL 171 11/04/2009 2253   TRIG 142 11/04/2009 2253   HDL 48 11/04/2009 2253   CHOLHDL 3.6 Ratio 11/04/2009 2253   VLDL 28 11/04/2009 2253   LDLCALC 95 11/04/2009 2253    Assessment and plan:   Patient Active Problem List   Diagnosis Date Noted  . Small cell lung cancer 04/24/2012  .  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 Acquired hypothyroidism 04/24/2012  . Serous cystadenoma of ovary 04/24/2012  . H/O chronic ulcerative colitis 04/24/2012  . Depression with anxiety 04/24/2012   Check labs today Obtain medical records  refill Provigil and she takes this daily for chronic fatigue The patient is going to go to the map program to inquire about assistance with the medication from the drug company.    Follow up lab results  The patient was given clear instructions to go to ER or return to medical center if symptoms don't improve, worsen or new problems develop.  The patient verbalized understanding.  The patient was told to call to get any lab results if not heard anything in the next week.    RTC in 4 months  Rodney Langton, MD, CDE, FAAFP [facility] [place], [place]

## 2013-03-30 NOTE — Patient Instructions (Addendum)
Cholesterol Cholesterol is a white, waxy, fat-like protein needed by your body in small amounts. The liver makes all the cholesterol you need. It is carried from the liver by the blood through the blood vessels. Deposits (plaque) may build up on blood vessel walls. This makes the arteries narrower and stiffer. Plaque increases the risk for heart attack and stroke. You cannot feel your cholesterol level even if it is very high. The only way to know is by a blood test to check your lipid (fats) levels. Once you know your cholesterol levels, you should keep a record of the test results. Work with your caregiver to to keep your levels in the desired range. WHAT THE RESULTS MEAN:  Total cholesterol is a rough measure of all the cholesterol in your blood.  LDL is the so-called bad cholesterol. This is the type that deposits cholesterol in the walls of the arteries. You want this level to be low.  HDL is the good cholesterol because it cleans the arteries and carries the LDL away. You want this level to be high.  Triglycerides are fat that the body can either burn for energy or store. High levels are closely linked to heart disease. DESIRED LEVELS:  Total cholesterol below 200.  LDL below 100 for people at risk, below 70 for very high risk.  HDL above 50 is good, above 60 is best.  Triglycerides below 150. HOW TO LOWER YOUR CHOLESTEROL:  Diet.  Choose fish or white meat chicken and [place], roasted or baked. Limit fatty cuts of red meat, fried foods, and processed meats, such as sausage and lunch meat.  Eat lots of fresh fruits and vegetables. Choose whole grains, beans, pasta, potatoes and cereals.  Use only small amounts of olive, corn or canola oils. Avoid butter, mayonnaise, shortening or palm kernel oils. Avoid foods with trans-fats.  Use skim/nonfat milk and low-fat/nonfat yogurt and cheeses. Avoid whole milk, cream, ice cream, egg yolks and cheeses. Healthy desserts include angel food  cake, ginger snaps, animal crackers, hard candy, popsicles, and low-fat/nonfat frozen yogurt. Avoid pastries, cakes, pies and cookies.  Exercise.  A regular program helps decrease LDL and raises HDL.  Helps with weight control.  Do things that increase your activity level like gardening, walking, or taking the stairs.  Medication.  May be prescribed by your caregiver to help lowering cholesterol and the risk for heart disease.  You may need medicine even if your levels are normal if you have several risk factors. HOME CARE INSTRUCTIONS   Follow your diet and exercise programs as suggested by your caregiver.  Take medications as directed.  Have blood work done when your caregiver feels it is necessary. MAKE SURE YOU:   Understand these instructions.  Will watch your condition.  Will get help right away if you are not doing well or get worse. Document Released: 06/01/2001 Document Revised: 11/29/2011 Document Reviewed: 11/22/2007 [facility] Patient Information 2014 [place], [place]. Chronic Fatigue Syndrome Chronic Fatigue Syndrome is characterized by extreme fatigue that does not improve with rest. The cause of this condition is unknown.  SYMPTOMS  An unexplained dramatic loss of energy.  Muscle or joint soreness.  Severe weakness.  Frequent headaches.  Fever, sore throat, and swollen lymph glands.  Sleep problems.  Inability to concentrate. Symptoms must usually be present for over 6 months before the diagnosis of chronic fatigue can be made. There is no diagnostic test for this disease. Many other diseases can cause similar symptoms. A complete medical evaluation  is needed to be sure you do not have other medical problems causing your symptoms. There is no specific treatment for Chronic Fatigue Syndrome. Cognitive behavioral therapy (similar to counseling) and/or a simple exercise regimen may be beneficial. Get plenty of rest and avoid alcohol and other depressant  drugs. Call your caregiver for follow up care as recommended. Document Released: 10/14/2004 Document Revised: 11/29/2011 Document Reviewed: 12/06/2008 [facility] Patient Information 2014 [place], [place].

## 2013-03-31 LAB — LIPID PANEL
Cholesterol: 208 mg/dL — ABNORMAL HIGH (ref 0–200)
HDL: 56 mg/dL (ref >39)
Total CHOL/HDL Ratio: 3.7 Ratio
Triglycerides: 99 mg/dL (ref <150)
VLDL: 20 mg/dL (ref 0–40)

## 2013-03-31 LAB — COMPLETE METABOLIC PANEL WITH GFR
ALT: 20 U/L (ref 0–35)
AST: 22 U/L (ref 0–37)
Alkaline Phosphatase: 63 U/L (ref 39–117)
Creat: 0.59 mg/dL (ref 0.50–1.10)
Sodium: 136 mEq/L (ref 135–145)
Total Bilirubin: 0.3 mg/dL (ref 0.3–1.2)

## 2013-04-02 ENCOUNTER — Telehealth: Payer: Self-pay

## 2013-04-02 NOTE — Progress Notes (Signed)
Quick Note:  Please inform patient that her labs came back OK except that her TSH level was elevated. Make sure patient is taking her thyroid medication and not missed any doses. Also her cholesterol levels were elevated. Recommend low fat low cholesterol diet and exercise 5x per week.   Rodney Langton, MD, CDE, FAAFP [facility] [place], [place]   ______

## 2013-04-02 NOTE — Telephone Encounter (Signed)
Message copied by Lestine Mount on Mon Apr 02, 2013  8:51 AM ------      Message from: Cleora Fleet      Created: Mon Apr 02, 2013  7:37 AM       Please inform patient that her labs came back OK except that her TSH level was elevated.  Make sure patient is taking her thyroid medication and not missed any doses.  Also her cholesterol levels were elevated.  Recommend low fat low cholesterol diet and exercise 5x per week.             Rodney Langton, MD, CDE, FAAFP      [facility]      [facility]      [place], [place]        ------

## 2013-04-02 NOTE — Telephone Encounter (Signed)
Left message on machine to return our call. 

## 2013-04-06 ENCOUNTER — Telehealth: Payer: Self-pay | Admitting: *Deleted

## 2013-04-06 NOTE — Telephone Encounter (Signed)
04/06/13  Attempt top reach patient message left via telephone that PAP test results came back OK.  P.Dailey Alberson,RN BSN [facility]

## 2013-04-06 NOTE — Progress Notes (Signed)
Quick Note:  Please inform patient that PAP test results came back OK.   Rodney Langton, MD, CDE, FAAFP [facility] [place], [place]   ______

## 2013-04-12 ENCOUNTER — Telehealth: Payer: Self-pay | Admitting: Family Medicine

## 2013-04-12 NOTE — Telephone Encounter (Signed)
Pt left vm saying that [facility] does not fill modafinil (PROVIGIL) 200 MG tablet, because it is a controlled substance.  Wants to know if there is an alternative.

## 2013-04-13 NOTE — Telephone Encounter (Signed)
Pt should try to transfer prescription to [facility] outpatient pharmacy because it also works with orange card.

## 2013-04-17 ENCOUNTER — Telehealth: Payer: Self-pay | Admitting: *Deleted

## 2013-04-17 NOTE — Telephone Encounter (Signed)
04/17/13 Patient made aware that [facility] will also except the orange card. P.Damaso Laday,RN BSN [facility]

## 2013-05-28 ENCOUNTER — Telehealth: Payer: Self-pay | Admitting: Internal Medicine

## 2013-05-28 NOTE — Telephone Encounter (Signed)
Pt called regarding her lab results, please contact pt °

## 2013-05-29 ENCOUNTER — Telehealth: Payer: Self-pay | Admitting: Emergency Medicine

## 2013-05-29 NOTE — Telephone Encounter (Signed)
Left a message for pt to call back for lab results.

## 2013-07-06 ENCOUNTER — Other Ambulatory Visit: Payer: Self-pay | Admitting: Internal Medicine

## 2013-07-06 MED ORDER — MODAFINIL 200 MG PO TABS: 200.0000 mg | ORAL_TABLET | Freq: Every day | ORAL | Status: DC

## 2013-07-09 ENCOUNTER — Other Ambulatory Visit: Payer: Self-pay | Admitting: Oncology

## 2013-07-09 DIAGNOSIS — Z1231 Encounter for screening mammogram for malignant neoplasm of breast: Secondary | ICD-10-CM

## 2013-07-20 ENCOUNTER — Telehealth: Payer: Self-pay | Admitting: Emergency Medicine

## 2013-07-20 NOTE — Telephone Encounter (Signed)
Pt requesting lab results from 03/30/13. Labs results given and scheduled appt for office visit

## 2013-07-26 ENCOUNTER — Ambulatory Visit (HOSPITAL_COMMUNITY)
Admission: RE | Admit: 2013-07-26 | Discharge: 2013-07-26 | Disposition: A | Discharge status: Home / Self Care | Payer: No Typology Code available for payment source | Source: Ambulatory Visit | Attending: Oncology | Admitting: Oncology

## 2013-07-26 DIAGNOSIS — Z1231 Encounter for screening mammogram for malignant neoplasm of breast: Secondary | ICD-10-CM | POA: Insufficient documentation

## 2013-08-23 ENCOUNTER — Ambulatory Visit: Payer: No Typology Code available for payment source | Admitting: Internal Medicine

## 2013-09-17 ENCOUNTER — Encounter: Payer: Self-pay | Admitting: Internal Medicine

## 2013-09-17 ENCOUNTER — Encounter (INDEPENDENT_AMBULATORY_CARE_PROVIDER_SITE_OTHER): Payer: Self-pay

## 2013-09-17 ENCOUNTER — Ambulatory Visit: Payer: Self-pay | Attending: Internal Medicine | Admitting: Internal Medicine

## 2013-09-17 VITALS — BP 124/83 | HR 103 | Temp 98.5°F | Resp 16 | Ht 64.0 in | Wt 206.0 lb

## 2013-09-17 DIAGNOSIS — Z139 Encounter for screening, unspecified: Secondary | ICD-10-CM

## 2013-09-17 DIAGNOSIS — E039 Hypothyroidism, unspecified: Secondary | ICD-10-CM | POA: Insufficient documentation

## 2013-09-17 DIAGNOSIS — E785 Hyperlipidemia, unspecified: Secondary | ICD-10-CM

## 2013-09-17 MED ORDER — MODAFINIL 200 MG PO TABS: 200.0000 mg | ORAL_TABLET | Freq: Every day | ORAL | Status: DC

## 2013-09-17 MED ORDER — LEVOTHYROXINE SODIUM 50 MCG PO TABS: 50.0000 ug | ORAL_TABLET | Freq: Every day | ORAL | Status: DC

## 2013-09-17 NOTE — Progress Notes (Signed)
MRN: [id number] Name: Stacy [name]  Sex: female Age: 57 y.o. DOB: [birth year]  Allergies: Review of patient's allergies indicates no known allergies.  Chief Complaint  Patient presents with  . Follow-up    HPI: Patient is 57 y.o. female who history of hypothyroidism hyperlipidemia, patient has not been taking her thyroid medication reported to have feeling fatigued tired and  has constipation also she is gained weight, she's requesting refill on her medication also has been taking Provigil for chronic fatigue/shift works disorder.  Past Medical History  Diagnosis Date  . 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 Rx Carboplatinum/etoposide X 6, Topotecan X 2, Radiation  . 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 Acquired hypothyroidism 04/24/2012  . 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 H/O chronic ulcerative colitis 04/24/2012    Mild   . Depression with anxiety 04/24/2012  . Vertigo   . Anxiety     Past Surgical History  Procedure Laterality Date  . Laproscopy    . Abdominal hysterectomy        Medication List       This list is accurate as of: 09/17/13  4:49 PM.  Always use your most recent med list.               aspirin EC 81 MG tablet  Take 81 mg by mouth daily.     cholecalciferol 1000 UNITS tablet  Commonly known as:  VITAMIN D  Take 1,000 Units by mouth daily.     fish oil-omega-3 fatty acids 1000 MG capsule  Take 1 g by mouth daily.     levothyroxine 50 MCG tablet  Commonly known as:  SYNTHROID, LEVOTHROID  Take 1 tablet (50 mcg total) by mouth daily before breakfast.     methocarbamol 750 MG tablet  Commonly known as:  ROBAXIN  Take 1 tablet (750 mg total) by mouth 4 (four) times daily as needed (Take 1 tablet every 6 hours as needed for muscle spasms.).     modafinil 200 MG tablet  Commonly known as:  PROVIGIL  Take 1 tablet (200 mg total) by  mouth daily.     multivitamin with minerals Tabs tablet  Take 1 tablet by mouth daily.     naproxen 500 MG tablet  Commonly known as:  NAPROSYN  Take 1 tablet (500 mg total) by mouth 2 (two) times daily as needed.     simethicone 125 MG chewable tablet  Commonly known as:  MYLICON  Chew 125 mg by mouth every 6 (six) hours as needed for flatulence.     venlafaxine XR 150 MG 24 hr capsule  Commonly known as:  EFFEXOR-XR  Take 150 mg by mouth daily.        Meds ordered this encounter  Medications  . levothyroxine (SYNTHROID, LEVOTHROID) 50 MCG tablet    Sig: Take 1 tablet (50 mcg total) by mouth daily before breakfast.    Dispense:  30 tablet    Refill:  4     There is no immunization history on file for this patient.  History reviewed. No pertinent family history.  History  Substance Use Topics  . Smoking status: Former Smoker    Quit date: 09/18/1999  . Smokeless tobacco: Never Used  . Alcohol Use: No    Review of Systems  As noted in HPI  Filed Vitals:   09/17/13 1621  BP: 124/83  Pulse: 103  Temp: 98.5 F (36.9 C)  Resp: 16    Physical Exam  Physical Exam  Constitutional: No distress.  Eyes: Pupils are equal, round, and reactive to light.  Cardiovascular: Normal rate and regular rhythm.   Pulmonary/Chest: Breath sounds normal. No respiratory distress. She has no wheezes. She has no rales.  Neurological: She has normal reflexes.    CBC    Component Value Date/Time   WBC 8.7 03/30/2013 1800   WBC 11.3* 11/17/2012 1235   RBC 4.51 03/30/2013 1800   RBC 4.18 11/17/2012 1235   HGB 13.0 03/30/2013 1800   HGB 12.1 11/17/2012 1235   HCT 38.5 03/30/2013 1800   HCT 36.4 11/17/2012 1235   PLT 335 03/30/2013 1800   PLT 356 11/17/2012 1235   MCV 85.4 03/30/2013 1800   MCV 87.0 11/17/2012 1235   LYMPHSABS 1.5 11/17/2012 1235   LYMPHSABS 1.9 05/10/2012 1118   MONOABS 0.8 11/17/2012 1235   MONOABS 0.6 05/10/2012 1118   EOSABS 0.4 11/17/2012 1235   EOSABS 0.3  05/10/2012 1118   BASOSABS 0.0 11/17/2012 1235   BASOSABS 0.0 05/10/2012 1118    CMP     Component Value Date/Time   NA 136 03/30/2013 1800   NA 138 11/17/2012 1235   K 4.3 03/30/2013 1800   K 3.8 11/17/2012 1235   CL 102 03/30/2013 1800   CL 104 11/17/2012 1235   CO2 27 03/30/2013 1800   CO2 24 11/17/2012 1235   GLUCOSE 85 03/30/2013 1800   GLUCOSE 95 11/17/2012 1235   BUN 15 03/30/2013 1800   BUN 19.9 11/17/2012 1235   CREATININE 0.59 03/30/2013 1800   CREATININE 0.7 11/17/2012 1235   CREATININE 0.70 05/10/2012 1147   CALCIUM 9.5 03/30/2013 1800   CALCIUM 9.3 11/17/2012 1235   PROT 8.1 03/30/2013 1800   PROT 8.0 11/17/2012 1235   ALBUMIN 4.8 03/30/2013 1800   ALBUMIN 3.7 11/17/2012 1235   AST 22 03/30/2013 1800   AST 17 11/17/2012 1235   ALT 20 03/30/2013 1800   ALT 17 11/17/2012 1235   ALKPHOS 63 03/30/2013 1800   ALKPHOS 79 11/17/2012 1235   BILITOT 0.3 03/30/2013 1800   BILITOT 0.21 11/17/2012 1235   GFRNONAA >90 05/10/2012 1118   GFRAA >90 05/10/2012 1118    Lab Results  Component Value Date/Time   CHOL 208* 03/30/2013  6:00 PM    No components found with this basename: hga1c    Lab Results  Component Value Date/Time   AST 22 03/30/2013  6:00 PM   AST 17 11/17/2012 12:35 PM    Assessment and Plan  Unspecified hypothyroidism - Plan: Will resume back on levothyroxine (SYNTHROID, LEVOTHROID) 50 MCG tablet daily and check TSH level prior to the next visit  Other and unspecified hyperlipidemia - Plan: Low-fat diet , we'll check  Lipid panel  Screening - Plan: COMPLETE METABOLIC PANEL WITH GFR, Vit D  25 hydroxy (rtn osteoporosis monitoring)    Return in about 2 months (around 11/17/2013).  Doris Cheadle, MD

## 2013-09-17 NOTE — Progress Notes (Signed)
Pt is here following up on the hypothyroidism. She is needing a refill of her medications.

## 2013-09-25 ENCOUNTER — Telehealth: Payer: Self-pay

## 2013-10-25 ENCOUNTER — Ambulatory Visit: Payer: Self-pay

## 2013-11-12 ENCOUNTER — Ambulatory Visit: Payer: Medicaid Other | Attending: Internal Medicine

## 2013-11-12 ENCOUNTER — Other Ambulatory Visit: Payer: Self-pay

## 2013-11-13 ENCOUNTER — Other Ambulatory Visit: Payer: Self-pay

## 2013-11-16 ENCOUNTER — Encounter: Payer: Self-pay | Admitting: Internal Medicine

## 2013-11-16 ENCOUNTER — Ambulatory Visit: Payer: Medicaid Other | Attending: Internal Medicine

## 2013-11-16 ENCOUNTER — Ambulatory Visit: Payer: Self-pay | Attending: Internal Medicine | Admitting: Internal Medicine

## 2013-11-16 VITALS — BP 134/90 | HR 80 | Temp 98.2°F | Resp 16 | Wt 209.0 lb

## 2013-11-16 DIAGNOSIS — J3489 Other specified disorders of nose and nasal sinuses: Secondary | ICD-10-CM | POA: Insufficient documentation

## 2013-11-16 DIAGNOSIS — Z87891 Personal history of nicotine dependence: Secondary | ICD-10-CM | POA: Insufficient documentation

## 2013-11-16 DIAGNOSIS — E039 Hypothyroidism, unspecified: Secondary | ICD-10-CM | POA: Insufficient documentation

## 2013-11-16 DIAGNOSIS — Z1211 Encounter for screening for malignant neoplasm of colon: Secondary | ICD-10-CM

## 2013-11-16 DIAGNOSIS — R0981 Nasal congestion: Secondary | ICD-10-CM

## 2013-11-16 DIAGNOSIS — R404 Transient alteration of awareness: Secondary | ICD-10-CM | POA: Insufficient documentation

## 2013-11-16 DIAGNOSIS — Z09 Encounter for follow-up examination after completed treatment for conditions other than malignant neoplasm: Secondary | ICD-10-CM | POA: Insufficient documentation

## 2013-11-16 DIAGNOSIS — E785 Hyperlipidemia, unspecified: Secondary | ICD-10-CM | POA: Insufficient documentation

## 2013-11-16 DIAGNOSIS — F3289 Other specified depressive episodes: Secondary | ICD-10-CM | POA: Insufficient documentation

## 2013-11-16 DIAGNOSIS — F411 Generalized anxiety disorder: Secondary | ICD-10-CM | POA: Insufficient documentation

## 2013-11-16 DIAGNOSIS — K5190 Ulcerative colitis, unspecified, without complications: Secondary | ICD-10-CM | POA: Insufficient documentation

## 2013-11-16 DIAGNOSIS — G4710 Hypersomnia, unspecified: Secondary | ICD-10-CM

## 2013-11-16 DIAGNOSIS — Z85118 Personal history of other malignant neoplasm of bronchus and lung: Secondary | ICD-10-CM | POA: Insufficient documentation

## 2013-11-16 DIAGNOSIS — F329 Major depressive disorder, single episode, unspecified: Secondary | ICD-10-CM | POA: Insufficient documentation

## 2013-11-16 DIAGNOSIS — Z923 Personal history of irradiation: Secondary | ICD-10-CM | POA: Insufficient documentation

## 2013-11-16 MED ORDER — ARMODAFINIL 200 MG PO TABS: 1.0000 | ORAL_TABLET | Freq: Every day | ORAL | Status: DC

## 2013-11-16 MED ORDER — FLUTICASONE PROPIONATE 50 MCG/ACT NA SUSP: 2.0000 | Freq: Every day | NASAL | Status: DC

## 2013-11-16 NOTE — Progress Notes (Signed)
Patient here for follow up History of thyroid disease

## 2013-11-16 NOTE — Progress Notes (Signed)
MRN: [id number] Name: Stacy [name]  Sex: female Age: 58 y.o. DOB: 1956-06-20  Allergies: Review of patient's allergies indicates no known allergies.  Chief Complaint  Patient presents with  . Follow-up    HPI: Patient is 58 y.o. female who has to of hypothyroidism, hyperlipidemia, comes today for followup, she forgot to do her blood work her which needs to be fasting, she also has history of excessive sleepiness/fatigue and has been taking Provigil, patient requests switching to know if you which will be covered by her insurance, she denies any acute symptoms except for recently have been having lot of stuff nose denies any fever chills chest pain shortness of breath.   Past Medical History  Diagnosis Date  . 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 Rx Carboplatinum/etoposide X 6, Topotecan X 2, Radiation  . 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 Acquired hypothyroidism 04/24/2012  . 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 H/O chronic ulcerative colitis 04/24/2012    Mild   . Depression with anxiety 04/24/2012  . Vertigo   . Anxiety     Past Surgical History  Procedure Laterality Date  . Laproscopy    . Abdominal hysterectomy        Medication List       This list is accurate as of: 11/16/13  4:28 PM.  Always use your most recent med list.               Armodafinil 200 MG Tabs  Commonly known as:  NUVIGIL  Take 1 tablet by mouth daily.     aspirin EC 81 MG tablet  Take 81 mg by mouth daily.     cholecalciferol 1000 UNITS tablet  Commonly known as:  VITAMIN D  Take 1,000 Units by mouth daily.     fish oil-omega-3 fatty acids 1000 MG capsule  Take 1 g by mouth daily.     fluticasone 50 MCG/ACT nasal spray  Commonly known as:  FLONASE  Place 2 sprays into both nostrils daily.     levothyroxine 50 MCG tablet  Commonly known as:  SYNTHROID, LEVOTHROID    Take 1 tablet (50 mcg total) by mouth daily before breakfast.     methocarbamol 750 MG tablet  Commonly known as:  ROBAXIN  Take 1 tablet (750 mg total) by mouth 4 (four) times daily as needed (Take 1 tablet every 6 hours as needed for muscle spasms.).     multivitamin with minerals Tabs tablet  Take 1 tablet by mouth daily.     naproxen 500 MG tablet  Commonly known as:  NAPROSYN  Take 1 tablet (500 mg total) by mouth 2 (two) times daily as needed.     simethicone 125 MG chewable tablet  Commonly known as:  MYLICON  Chew 115 mg by mouth every 6 (six) hours as needed for flatulence.     venlafaxine XR 150 MG 24 hr capsule  Commonly known as:  EFFEXOR-XR  Take 150 mg by mouth daily.        Meds ordered this encounter  Medications  . fluticasone (FLONASE) 50 MCG/ACT nasal spray    Sig: Place 2 sprays into both nostrils daily.    Dispense:  16 g    Refill:  1  . Armodafinil (NUVIGIL) 200 MG TABS    Sig: Take 1 tablet by mouth daily.    Dispense:  30  tablet    Refill:  3     There is no immunization history on file for this patient.  History reviewed. No pertinent family history.  History  Substance Use Topics  . Smoking status: Former Smoker    Quit date: 09/18/1999  . Smokeless tobacco: Never Used  . Alcohol Use: No    Review of Systems   As noted in HPI  Filed Vitals:   11/16/13 1556  BP: 134/90  Pulse: 80  Temp: 98.2 F (36.8 C)  Resp: 16    Physical Exam  Physical Exam  HENT:  Some nasal congestion no sinus tenderness minimal pharyngeal erythema  Eyes: EOM are normal. Pupils are equal, round, and reactive to light.  Cardiovascular: Normal rate and regular rhythm.   Pulmonary/Chest: Breath sounds normal. No respiratory distress. She has no wheezes. She has no rales.    CBC    Component Value Date/Time   WBC 8.7 03/30/2013 1800   WBC 11.3* 11/17/2012 1235   RBC 4.51 03/30/2013 1800   RBC 4.18 11/17/2012 1235   HGB 13.0 03/30/2013 1800   HGB  12.1 11/17/2012 1235   HCT 38.5 03/30/2013 1800   HCT 36.4 11/17/2012 1235   PLT 335 03/30/2013 1800   PLT 356 11/17/2012 1235   MCV 85.4 03/30/2013 1800   MCV 87.0 11/17/2012 1235   LYMPHSABS 1.5 11/17/2012 1235   LYMPHSABS 1.9 05/10/2012 1118   MONOABS 0.8 11/17/2012 1235   MONOABS 0.6 05/10/2012 1118   EOSABS 0.4 11/17/2012 1235   EOSABS 0.3 05/10/2012 1118   BASOSABS 0.0 11/17/2012 1235   BASOSABS 0.0 05/10/2012 1118    CMP     Component Value Date/Time   NA 136 03/30/2013 1800   NA 138 11/17/2012 1235   K 4.3 03/30/2013 1800   K 3.8 11/17/2012 1235   CL 102 03/30/2013 1800   CL 104 11/17/2012 1235   CO2 27 03/30/2013 1800   CO2 24 11/17/2012 1235   GLUCOSE 85 03/30/2013 1800   GLUCOSE 95 11/17/2012 1235   BUN 15 03/30/2013 1800   BUN 19.9 11/17/2012 1235   CREATININE 0.59 03/30/2013 1800   CREATININE 0.7 11/17/2012 1235   CREATININE 0.70 05/10/2012 1147   CALCIUM 9.5 03/30/2013 1800   CALCIUM 9.3 11/17/2012 1235   PROT 8.1 03/30/2013 1800   PROT 8.0 11/17/2012 1235   ALBUMIN 4.8 03/30/2013 1800   ALBUMIN 3.7 11/17/2012 1235   AST 22 03/30/2013 1800   AST 17 11/17/2012 1235   ALT 20 03/30/2013 1800   ALT 17 11/17/2012 1235   ALKPHOS 63 03/30/2013 1800   ALKPHOS 79 11/17/2012 1235   BILITOT 0.3 03/30/2013 1800   BILITOT 0.21 11/17/2012 1235   GFRNONAA >90 05/10/2012 1118   GFRAA >90 05/10/2012 1118    Lab Results  Component Value Date/Time   CHOL 208* 03/30/2013  6:00 PM    No components found with this basename: hga1c    Lab Results  Component Value Date/Time   AST 22 03/30/2013  6:00 PM   AST 17 11/17/2012 12:35 PM    Assessment and Plan  Unspecified hypothyroidism Will check TSH level. Blood work is already ordered in the system  Other and unspecified hyperlipidemia We'll check fasting lipid profile.   Stuffy nose - Plan: fluticasone (FLONASE) 50 MCG/ACT nasal spray  Special screening for malignant neoplasms, colon - Plan: Ambulatory referral to Gastroenterology  Excessive  sleepiness - Plan: Armodafinil (NUVIGIL) 200 MG TABS   Return in about 3  months (around 02/13/2014).  Lorayne Marek, MD

## 2013-11-17 ENCOUNTER — Telehealth: Payer: Self-pay | Admitting: *Deleted

## 2013-11-17 ENCOUNTER — Encounter: Payer: Self-pay | Admitting: Oncology

## 2013-11-17 NOTE — Telephone Encounter (Signed)
Mailed provider change letter to pt.

## 2013-11-20 ENCOUNTER — Ambulatory Visit: Payer: Self-pay | Attending: Internal Medicine

## 2013-11-20 DIAGNOSIS — E785 Hyperlipidemia, unspecified: Secondary | ICD-10-CM

## 2013-11-20 DIAGNOSIS — C3491 Malignant neoplasm of unspecified part of right bronchus or lung: Secondary | ICD-10-CM

## 2013-11-20 DIAGNOSIS — Z139 Encounter for screening, unspecified: Secondary | ICD-10-CM

## 2013-11-20 LAB — COMPLETE METABOLIC PANEL WITH GFR
ALBUMIN: 4.5 g/dL (ref 3.5–5.2)
ALK PHOS: 59 U/L (ref 39–117)
ALT: 17 U/L (ref 0–35)
AST: 16 U/L (ref 0–37)
BUN: 17 mg/dL (ref 6–23)
CALCIUM: 9.5 mg/dL (ref 8.4–10.5)
CHLORIDE: 103 meq/L (ref 96–112)
CO2: 29 mEq/L (ref 19–32)
CREATININE: 0.59 mg/dL (ref 0.50–1.10)
GFR, Est African American: >89 mL/min
GFR, Est Non African American: >89 mL/min
Glucose, Bld: 89 mg/dL (ref 70–99)
POTASSIUM: 4.5 meq/L (ref 3.5–5.3)
Sodium: 136 mEq/L (ref 135–145)
Total Bilirubin: 0.3 mg/dL (ref 0.2–1.2)
Total Protein: 7.8 g/dL (ref 6.0–8.3)

## 2013-11-20 LAB — CBC WITH DIFFERENTIAL/PLATELET
BASOS ABS: 0 10*3/uL (ref 0.0–0.1)
BASOS PCT: 0 % (ref 0–1)
Eosinophils Absolute: 0.2 10*3/uL (ref 0.0–0.7)
Eosinophils Relative: 4 % (ref 0–5)
HEMATOCRIT: 39.4 % (ref 36.0–46.0)
Hemoglobin: 13.1 g/dL (ref 12.0–15.0)
LYMPHS PCT: 28 % (ref 12–46)
Lymphs Abs: 1.6 10*3/uL (ref 0.7–4.0)
MCH: 28.9 pg (ref 26.0–34.0)
MCHC: 33.2 g/dL (ref 30.0–36.0)
MCV: 86.8 fL (ref 78.0–100.0)
MONO ABS: 0.3 10*3/uL (ref 0.1–1.0)
Monocytes Relative: 6 % (ref 3–12)
NEUTROS ABS: 3.5 10*3/uL (ref 1.7–7.7)
NEUTROS PCT: 62 % (ref 43–77)
PLATELETS: 363 10*3/uL (ref 150–400)
RBC: 4.54 MIL/uL (ref 3.87–5.11)
RDW: 13.6 % (ref 11.5–15.5)
WBC: 5.6 10*3/uL (ref 4.0–10.5)

## 2013-11-20 LAB — LIPID PANEL
CHOL/HDL RATIO: 3.7 ratio
CHOLESTEROL: 187 mg/dL (ref 0–200)
HDL: 50 mg/dL (ref >39)
LDL Cholesterol: 116 mg/dL — ABNORMAL HIGH (ref 0–99)
Triglycerides: 103 mg/dL (ref <150)
VLDL: 21 mg/dL (ref 0–40)

## 2013-11-20 LAB — LACTATE DEHYDROGENASE: LDH: 114 U/L (ref 94–250)

## 2013-11-20 LAB — TSH: TSH: 1.61 u[IU]/mL (ref 0.350–4.500)

## 2013-11-21 ENCOUNTER — Telehealth: Payer: Self-pay | Admitting: *Deleted

## 2013-11-21 ENCOUNTER — Telehealth: Payer: Self-pay

## 2013-11-21 LAB — VITAMIN D 25 HYDROXY (VIT D DEFICIENCY, FRACTURES): Vit D, 25-Hydroxy: 75 ng/mL (ref 30–89)

## 2013-11-21 NOTE — Telephone Encounter (Signed)
Left message 1st attempt.  

## 2013-11-21 NOTE — Telephone Encounter (Signed)
Message copied by Dorothe Pea on Wed Nov 21, 2013  3:34 PM ------      Message from: Lorayne Marek      Created: Wed Nov 21, 2013  9:45 AM       Call and let the patient know that her blood work is normal including her TSH level, continue with current dose of levothyroxine. ------

## 2013-11-21 NOTE — Telephone Encounter (Signed)
Message copied by Joan Mayans on Wed Nov 21, 2013  2:56 PM ------      Message from: Lorayne Marek      Created: Wed Nov 21, 2013  9:45 AM       Call and let the patient know that her blood work is normal including her TSH level, continue with current dose of levothyroxine. ------

## 2013-11-21 NOTE — Telephone Encounter (Signed)
Patient is aware of her lab results 

## 2013-11-23 ENCOUNTER — Other Ambulatory Visit: Payer: Self-pay | Admitting: Internal Medicine

## 2013-11-23 ENCOUNTER — Other Ambulatory Visit: Payer: Medicaid Other

## 2013-11-23 DIAGNOSIS — G4710 Hypersomnia, unspecified: Secondary | ICD-10-CM

## 2013-11-23 DIAGNOSIS — Z85118 Personal history of other malignant neoplasm of bronchus and lung: Secondary | ICD-10-CM

## 2013-11-23 DIAGNOSIS — E039 Hypothyroidism, unspecified: Secondary | ICD-10-CM

## 2013-11-23 LAB — COMPREHENSIVE METABOLIC PANEL (CC13)
ALK PHOS: 69 U/L (ref 40–150)
ALT: 22 U/L (ref 0–55)
ANION GAP: 11 meq/L (ref 3–11)
AST: 20 U/L (ref 5–34)
Albumin: 4.5 g/dL (ref 3.5–5.0)
BUN: 16.9 mg/dL (ref 7.0–26.0)
CO2: 23 meq/L (ref 22–29)
CREATININE: 0.6 mg/dL (ref 0.6–1.1)
Calcium: 10.2 mg/dL (ref 8.4–10.4)
Chloride: 104 mEq/L (ref 98–109)
Glucose: 94 mg/dl (ref 70–140)
Potassium: 3.9 mEq/L (ref 3.5–5.1)
SODIUM: 138 meq/L (ref 136–145)
TOTAL PROTEIN: 8.5 g/dL — AB (ref 6.4–8.3)

## 2013-11-23 LAB — CBC WITH DIFFERENTIAL/PLATELET
BASO%: 0.6 % (ref 0.0–2.0)
Basophils Absolute: 0.1 10*3/uL (ref 0.0–0.1)
EOS%: 3.5 % (ref 0.0–7.0)
Eosinophils Absolute: 0.4 10*3/uL (ref 0.0–0.5)
HEMATOCRIT: 39.7 % (ref 34.8–46.6)
HGB: 13 g/dL (ref 11.6–15.9)
LYMPH%: 19.3 % (ref 14.0–49.7)
MCH: 28.8 pg (ref 25.1–34.0)
MCHC: 32.6 g/dL (ref 31.5–36.0)
MCV: 88.2 fL (ref 79.5–101.0)
MONO#: 0.7 10*3/uL (ref 0.1–0.9)
MONO%: 7.2 % (ref 0.0–14.0)
NEUT#: 7 10*3/uL — ABNORMAL HIGH (ref 1.5–6.5)
NEUT%: 69.4 % (ref 38.4–76.8)
PLATELETS: 362 10*3/uL (ref 145–400)
RBC: 4.5 10*6/uL (ref 3.70–5.45)
RDW: 13.7 % (ref 11.2–14.5)
WBC: 10.1 10*3/uL (ref 3.9–10.3)
lymph#: 2 10*3/uL (ref 0.9–3.3)

## 2013-11-23 LAB — LACTATE DEHYDROGENASE (CC13): LDH: 142 U/L (ref 125–245)

## 2013-11-23 MED ORDER — ARMODAFINIL 200 MG PO TABS: 1.0000 | ORAL_TABLET | Freq: Every day | ORAL | Status: DC

## 2013-11-23 MED ORDER — LEVOTHYROXINE SODIUM 50 MCG PO TABS: 50.0000 ug | ORAL_TABLET | Freq: Every day | ORAL | Status: DC

## 2013-11-26 ENCOUNTER — Ambulatory Visit (HOSPITAL_BASED_OUTPATIENT_CLINIC_OR_DEPARTMENT_OTHER): Payer: Self-pay | Admitting: Oncology

## 2013-11-26 VITALS — BP 138/80 | HR 102 | Temp 97.4°F | Resp 18 | Ht 64.0 in | Wt 206.2 lb

## 2013-11-26 DIAGNOSIS — Z85118 Personal history of other malignant neoplasm of bronchus and lung: Secondary | ICD-10-CM

## 2013-11-26 DIAGNOSIS — C3490 Malignant neoplasm of unspecified part of unspecified bronchus or lung: Secondary | ICD-10-CM

## 2013-11-27 NOTE — Progress Notes (Signed)
Hematology and Oncology Follow Up Visit  Stacy [name] [id number] 1956/04/18 58 y.o. 11/27/2013 7:45 PM   Principle Diagnosis: Encounter Diagnosis  Name Primary?  . Small cell lung cancer Yes     Interim History:  Followup visit for this 57 year old woman who presented with an enlarged right supraclavicular lymph node in May of 2000 as the first sign of limited stage small cell lung cancer. She was treated very aggressively with chemotherapy and radiation therapy. Other than developing radiation pneumonitis, which has left some chronic scarring in the right hilar area on chest radiographic and CT, she suffered no major complications from treatment and remains in a remission at this time, out an amazing 15 years. She has had no interim medical problems. She did have a mammogram 07/26/2013 which was normal. Last chest x-ray was 10/03/2012 and was unchanged. I will have her come back for another study this week. She continues to have a number of life stress. 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 Her youngest son will be 77 years old soon. He has congenital panhypopituitarism. Her older son is 56. She is divorced and never got remarried. We spent most of the session talking about her social situation.    Medications: reviewed  Allergies: No Known Allergies  Review of Systems: Hematology:  No bleeding or bruising ENT ROS: No sore throat Breast ROS: Current with mammograms Respiratory ROS: No cough or dyspnea Cardiovascular ROS:  No chest pain or palpitations Gastrointestinal ROS: No abdominal pain or change in bowel habit   Genito-Urinary ROS: No urinary tract symptoms. No vaginal bleeding. Musculoskeletal ROS: No muscle bone or joint pain Neurological ROS: No headache or change in vision Dermatological ROS: No rash or ecchymosis Remaining ROS negative:   Physical  Exam: Blood pressure 138/80, pulse 102, temperature 97.4 F (36.3 C), temperature source Oral, resp. rate 18, height 5\' 4"  (1.626 m), weight 206 lb 3.2 oz (93.532 kg), SpO2 97.00%. Wt Readings from Last 3 Encounters:  11/26/13 206 lb 3.2 oz (93.532 kg)  11/16/13 209 lb (94.802 kg)  09/17/13 206 lb (93.441 kg)     General appearance: Well-nourished Caucasian woman HENNT: Pharynx no erythema, exudate, mass, or ulcer. No thyromegaly or thyroid nodules Lymph nodes: No cervical, supraclavicular, or axillary lymphadenopathy Breasts:  Lungs: Clear to auscultation, resonant to percussion throughout Heart: Regular rhythm, no murmur, no gallop, no rub, no click, no edema Abdomen: Soft, nontender, normal bowel sounds, no mass, no organomegaly Extremities: No edema, no calf tenderness Musculoskeletal: no joint deformities GU:  Vascular: Carotid pulses 2+, no bruits,  Neurologic: Alert, oriented, PERRLA, , cranial nerves grossly normal, motor strength 5 over 5, reflexes 1+ symmetric, upper body coordination normal, gait normal, Skin: No rash or ecchymosis  Lab Results: CBC W/Diff    Component Value Date/Time   WBC 10.1 11/23/2013 1604   WBC 5.6 11/20/2013 0952   RBC 4.50 11/23/2013 1604   RBC 4.54 11/20/2013 0952   HGB 13.0 11/23/2013 1604   HGB 13.1 11/20/2013 0952   HCT 39.7 11/23/2013 1604   HCT 39.4 11/20/2013 0952   PLT 362 11/23/2013 1604   PLT 363 11/20/2013 0952   MCV 88.2 11/23/2013 1604   MCV 86.8 11/20/2013 0952   MCH 28.8 11/23/2013 1604   MCH 28.9 11/20/2013 0952   MCHC 32.6 11/23/2013 1604   MCHC 33.2 11/20/2013 0952  RDW 13.7 11/23/2013 1604   RDW 13.6 11/20/2013 0952   LYMPHSABS 2.0 11/23/2013 1604   LYMPHSABS 1.6 11/20/2013 0952   MONOABS 0.7 11/23/2013 1604   MONOABS 0.3 11/20/2013 0952   EOSABS 0.4 11/23/2013 1604   EOSABS 0.2 11/20/2013 0952   BASOSABS 0.1 11/23/2013 1604   BASOSABS 0.0 11/20/2013 0952     Chemistry      Component Value Date/Time   NA 138 11/23/2013 1604   NA 136 11/20/2013 0947   K 3.9  11/23/2013 1604   K 4.5 11/20/2013 0947   CL 103 11/20/2013 0947   CL 104 11/17/2012 1235   CO2 23 11/23/2013 1604   CO2 29 11/20/2013 0947   BUN 16.9 11/23/2013 1604   BUN 17 11/20/2013 0947   CREATININE 0.6 11/23/2013 1604   CREATININE 0.59 11/20/2013 0947   CREATININE 0.70 05/10/2012 1147      Component Value Date/Time   CALCIUM 10.2 11/23/2013 1604   CALCIUM 9.5 11/20/2013 0947   ALKPHOS 69 11/23/2013 1604   ALKPHOS 59 11/20/2013 0947   AST 20 11/23/2013 1604   AST 16 11/20/2013 0947   ALT 22 11/23/2013 1604   ALT 17 11/20/2013 0947   BILITOT <0.20 11/23/2013 1604   BILITOT 0.3 11/20/2013 0947       Radiological Studies: Pending   Impression:  #1. Limited stage small cell lung cancer She remains free of any obvious recurrence now almost 15 years from diagnosis. I will transition her care to Dr. Julien Nordmann.   CC: Patient Care Team: Angelica Chessman, MD as PCP - General (Internal Medicine)   Annia Belt, MD 3/10/20157:45 PM

## 2013-12-26 ENCOUNTER — Other Ambulatory Visit: Payer: Self-pay | Admitting: Internal Medicine

## 2013-12-26 DIAGNOSIS — G4710 Hypersomnia, unspecified: Secondary | ICD-10-CM

## 2013-12-26 MED ORDER — ARMODAFINIL 150 MG PO TABS: 150.0000 mg | ORAL_TABLET | Freq: Every day | ORAL | Status: DC

## 2014-01-17 ENCOUNTER — Telehealth: Payer: Self-pay | Admitting: *Deleted

## 2014-01-17 DIAGNOSIS — R0981 Nasal congestion: Secondary | ICD-10-CM

## 2014-01-17 MED ORDER — FLUTICASONE PROPIONATE 50 MCG/ACT NA SUSP: 2.0000 | Freq: Every day | NASAL | Status: DC

## 2014-01-17 NOTE — Telephone Encounter (Signed)
Patient called wanting a refill on Flonase prescription. Informed patient we will refill this one time and she needs to schedule a follow up appointment with Dr. Annitta Needs or Dr. Doreene Burke. Patient verbalized understanding. Alverda Skeans, RN

## 2014-04-08 ENCOUNTER — Ambulatory Visit: Payer: Self-pay | Admitting: Internal Medicine

## 2014-06-25 ENCOUNTER — Other Ambulatory Visit: Payer: Self-pay | Admitting: Internal Medicine

## 2014-06-25 DIAGNOSIS — Z1231 Encounter for screening mammogram for malignant neoplasm of breast: Secondary | ICD-10-CM

## 2014-07-30 ENCOUNTER — Ambulatory Visit (HOSPITAL_COMMUNITY)
Admission: RE | Admit: 2014-07-30 | Discharge: 2014-07-30 | Disposition: A | Discharge status: Home / Self Care | Payer: Medicaid Other | Source: Ambulatory Visit | Attending: Internal Medicine | Admitting: Internal Medicine

## 2014-07-30 DIAGNOSIS — Z1231 Encounter for screening mammogram for malignant neoplasm of breast: Secondary | ICD-10-CM

## 2014-08-07 ENCOUNTER — Telehealth: Payer: Self-pay | Admitting: Internal Medicine

## 2014-08-07 ENCOUNTER — Telehealth: Payer: Self-pay | Admitting: Emergency Medicine

## 2014-08-07 DIAGNOSIS — E039 Hypothyroidism, unspecified: Secondary | ICD-10-CM

## 2014-08-07 MED ORDER — ARMODAFINIL 150 MG PO TABS: 150.0000 mg | ORAL_TABLET | Freq: Every day | ORAL | Status: DC

## 2014-08-07 MED ORDER — LEVOTHYROXINE SODIUM 50 MCG PO TABS: 50.0000 ug | ORAL_TABLET | Freq: Every day | ORAL | Status: DC

## 2014-08-07 NOTE — Telephone Encounter (Signed)
  Pt. Called to request a refill for levothyroxine (SYNTHROID, LEVOTHROID) 50 MCG tablet, and Nubigil 150 mg Pt. Would like for medication sent to [facility] on [address]. Please f/u with pt.

## 2014-08-07 NOTE — Telephone Encounter (Signed)
Pt instructed we will refill medication Levothyroxine and Armodafinil 150 mg tab #30,no refills until seen by provider Medication e-scribed to [facility] pharmacy per Dr. Annitta Needs

## 2014-08-07 NOTE — Telephone Encounter (Signed)
Pt. Called to request a refill for levothyroxine (SYNTHROID, LEVOTHROID) 50 MCG tablet, and Nubigil 150 mg Pt. Would like for medication sent to [facility] on [address]. Please f/u with pt.

## 2014-08-07 NOTE — Telephone Encounter (Signed)
Patient has not been seen in our office for several months, she can be given one month supply and advise patient to schedule appointment.

## 2014-08-21 ENCOUNTER — Ambulatory Visit (HOSPITAL_BASED_OUTPATIENT_CLINIC_OR_DEPARTMENT_OTHER): Payer: Medicaid Other

## 2014-08-21 ENCOUNTER — Encounter: Payer: Self-pay | Admitting: Internal Medicine

## 2014-08-21 ENCOUNTER — Ambulatory Visit: Payer: Medicaid Other | Attending: Internal Medicine | Admitting: Internal Medicine

## 2014-08-21 VITALS — BP 112/75 | HR 93 | Temp 98.2°F | Resp 18 | Ht 64.0 in | Wt 197.0 lb

## 2014-08-21 DIAGNOSIS — G8929 Other chronic pain: Secondary | ICD-10-CM | POA: Diagnosis not present

## 2014-08-21 DIAGNOSIS — Z7951 Long term (current) use of inhaled steroids: Secondary | ICD-10-CM | POA: Insufficient documentation

## 2014-08-21 DIAGNOSIS — Z7982 Long term (current) use of aspirin: Secondary | ICD-10-CM | POA: Insufficient documentation

## 2014-08-21 DIAGNOSIS — M545 Low back pain, unspecified: Secondary | ICD-10-CM

## 2014-08-21 DIAGNOSIS — Z87891 Personal history of nicotine dependence: Secondary | ICD-10-CM | POA: Insufficient documentation

## 2014-08-21 DIAGNOSIS — Z923 Personal history of irradiation: Secondary | ICD-10-CM | POA: Diagnosis not present

## 2014-08-21 DIAGNOSIS — G4710 Hypersomnia, unspecified: Secondary | ICD-10-CM | POA: Insufficient documentation

## 2014-08-21 DIAGNOSIS — Z23 Encounter for immunization: Secondary | ICD-10-CM

## 2014-08-21 DIAGNOSIS — F418 Other specified anxiety disorders: Secondary | ICD-10-CM | POA: Insufficient documentation

## 2014-08-21 DIAGNOSIS — Z85118 Personal history of other malignant neoplasm of bronchus and lung: Secondary | ICD-10-CM | POA: Insufficient documentation

## 2014-08-21 DIAGNOSIS — Z0000 Encounter for general adult medical examination without abnormal findings: Secondary | ICD-10-CM

## 2014-08-21 DIAGNOSIS — E039 Hypothyroidism, unspecified: Secondary | ICD-10-CM | POA: Insufficient documentation

## 2014-08-21 DIAGNOSIS — Z1211 Encounter for screening for malignant neoplasm of colon: Secondary | ICD-10-CM

## 2014-08-21 MED ORDER — ARMODAFINIL 150 MG PO TABS: 150.0000 mg | ORAL_TABLET | Freq: Every day | ORAL | Status: DC

## 2014-08-21 MED ORDER — LEVOTHYROXINE SODIUM 50 MCG PO TABS: 50.0000 ug | ORAL_TABLET | Freq: Every day | ORAL | Status: DC

## 2014-08-21 MED ORDER — CYCLOBENZAPRINE HCL 10 MG PO TABS: 10.0000 mg | ORAL_TABLET | Freq: Every day | ORAL | Status: DC

## 2014-08-21 NOTE — Progress Notes (Signed)
MRN: [id number] Name: Stacy [name]  Sex: female Age: 58 y.o. DOB: [birth year]  Allergies: Review of patient's allergies indicates no known allergies.  Chief Complaint  Patient presents with  . Thyroid Problem    HPI: Patient is 58 y.o. female who has history of hypothyroidism, excessive sleepiness, anxiety/depression, chronic low back pain comes today for followup , requesting refill on her medications, she is also requesting referral to see the pain management, has chronic lower back pain denies any shooting pain down to the legs denies any incontinence has been take over-the-counter pain medications, patient also follows up with psychiatrist used to be on Effexor now switched  to Prozac , she denies any SI or HI. Patient will like to get a flu shot and pneumonia shot today. She is up-to-date with her mammogram, she was referred to GI in the past for screening colonoscopy but has not make an appointment yet.  Past Medical History  Diagnosis Date  . 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 Rx Carboplatinum/etoposide X 6, Topotecan X 2, Radiation  . 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 Acquired hypothyroidism 04/24/2012  . 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 H/O chronic ulcerative colitis 04/24/2012    Mild   . Depression with anxiety 04/24/2012  . Vertigo   . Anxiety     Past Surgical History  Procedure Laterality Date  . Laproscopy    . Abdominal hysterectomy        Medication List       This list is accurate as of: 08/21/14  5:30 PM.  Always use your most recent med list.               Armodafinil 150 MG tablet  Take 1 tablet (150 mg total) by mouth daily.     aspirin EC 81 MG tablet  Take 81 mg by mouth daily.     cholecalciferol 1000 UNITS tablet  Commonly known as:  VITAMIN D  Take 1,000 Units by mouth daily.     Flaxseed Oil 1000 MG Caps  Take 1  capsule by mouth daily.     FLUoxetine 40 MG capsule  Commonly known as:  PROZAC  Take 40 mg by mouth daily.     fluticasone 50 MCG/ACT nasal spray  Commonly known as:  FLONASE  Place 2 sprays into both nostrils daily.     levothyroxine 50 MCG tablet  Commonly known as:  SYNTHROID, LEVOTHROID  Take 1 tablet (50 mcg total) by mouth daily before breakfast.     multivitamin with minerals Tabs tablet  Take 1 tablet by mouth daily.     naproxen 500 MG tablet  Commonly known as:  NAPROSYN  Take 1 tablet (500 mg total) by mouth 2 (two) times daily as needed.     PROBIOTIC DAILY PO  Take 1 tablet by mouth daily.     simethicone 125 MG chewable tablet  Commonly known as:  MYLICON  Chew 578 mg by mouth every 6 (six) hours as needed for flatulence.     venlafaxine XR 150 MG 24 hr capsule  Commonly known as:  EFFEXOR-XR  Take 150 mg by mouth daily.        Meds ordered this encounter  Medications  . FLUoxetine (PROZAC) 40 MG capsule    Sig: Take 40 mg by mouth daily.  Marland Kitchen levothyroxine (SYNTHROID, LEVOTHROID) 50 MCG tablet  Sig: Take 1 tablet (50 mcg total) by mouth daily before breakfast.    Dispense:  30 tablet    Refill:  3  . Armodafinil 150 MG tablet    Sig: Take 1 tablet (150 mg total) by mouth daily.    Dispense:  30 tablet    Refill:  0    Immunization History  Administered Date(s) Administered  . Influenza,inj,Quad PF,36+ Mos 08/21/2014  . Pneumococcal Polysaccharide-23 08/21/2014    No family history on file.  History  Substance Use Topics  . Smoking status: Former Smoker    Quit date: 09/18/1999  . Smokeless tobacco: Never Used  . Alcohol Use: No    Review of Systems   As noted in HPI  Filed Vitals:   08/21/14 1703  BP: 112/75  Pulse: 93  Temp: 98.2 F (36.8 C)  Resp: 18    Physical Exam  Physical Exam  Constitutional: No distress.  Eyes: EOM are normal. Pupils are equal, round, and reactive to light.  Cardiovascular: Normal rate and  regular rhythm.   Pulmonary/Chest: Breath sounds normal. No respiratory distress. She has no wheezes. She has no rales.  Musculoskeletal: She exhibits no edema.  Minimal lower lumbar paraspinal tenderness SLR test negative     CBC    Component Value Date/Time   WBC 10.1 11/23/2013 1604   WBC 5.6 11/20/2013 0952   RBC 4.50 11/23/2013 1604   RBC 4.54 11/20/2013 0952   HGB 13.0 11/23/2013 1604   HGB 13.1 11/20/2013 0952   HCT 39.7 11/23/2013 1604   HCT 39.4 11/20/2013 0952   PLT 362 11/23/2013 1604   PLT 363 11/20/2013 0952   MCV 88.2 11/23/2013 1604   MCV 86.8 11/20/2013 0952   LYMPHSABS 2.0 11/23/2013 1604   LYMPHSABS 1.6 11/20/2013 0952   MONOABS 0.7 11/23/2013 1604   MONOABS 0.3 11/20/2013 0952   EOSABS 0.4 11/23/2013 1604   EOSABS 0.2 11/20/2013 0952   BASOSABS 0.1 11/23/2013 1604   BASOSABS 0.0 11/20/2013 0952    CMP     Component Value Date/Time   NA 138 11/23/2013 1604   NA 136 11/20/2013 0947   K 3.9 11/23/2013 1604   K 4.5 11/20/2013 0947   CL 103 11/20/2013 0947   CL 104 11/17/2012 1235   CO2 23 11/23/2013 1604   CO2 29 11/20/2013 0947   GLUCOSE 94 11/23/2013 1604   GLUCOSE 89 11/20/2013 0947   GLUCOSE 95 11/17/2012 1235   BUN 16.9 11/23/2013 1604   BUN 17 11/20/2013 0947   CREATININE 0.6 11/23/2013 1604   CREATININE 0.59 11/20/2013 0947   CREATININE 0.70 05/10/2012 1147   CALCIUM 10.2 11/23/2013 1604   CALCIUM 9.5 11/20/2013 0947   PROT 8.5* 11/23/2013 1604   PROT 7.8 11/20/2013 0947   ALBUMIN 4.5 11/23/2013 1604   ALBUMIN 4.5 11/20/2013 0947   AST 20 11/23/2013 1604   AST 16 11/20/2013 0947   ALT 22 11/23/2013 1604   ALT 17 11/20/2013 0947   ALKPHOS 69 11/23/2013 1604   ALKPHOS 59 11/20/2013 0947   BILITOT <0.20 11/23/2013 1604   BILITOT 0.3 11/20/2013 0947   GFRNONAA >89 11/20/2013 0947   GFRNONAA >90 05/10/2012 1118   GFRAA >89 11/20/2013 0947   GFRAA >90 05/10/2012 1118    Lab Results  Component Value Date/Time   CHOL 187 11/20/2013  09:47 AM    No components found for: HGA1C  Lab Results  Component Value Date/Time   AST 20 11/23/2013 04:04 PM  AST 16 11/20/2013 09:47 AM    Assessment and Plan  Hypothyroidism, unspecified hypothyroidism type - Plan: patient is on levothyroxine (SYNTHROID, LEVOTHROID) 50 MCG tablet, will recheck her TSH level  Chronic low back pain - Plan: Ambulatory referral to Pain Clinic  Special screening for malignant neoplasms, colon - Plan: Ambulatory referral to Gastroenterology  Excessive sleepiness - Plan: Armodafinil 150 MG tablet  Needs flu shot Flu shot given today  Preventative health care Pneumovax given today  Health Maintenance -Colonoscopy: referred to GI  -Mammogram: uptodate  -Vaccinations:  Flu shot and pneumvax today   Return in about 3 months (around 11/20/2014).  Lorayne Marek, MD

## 2014-08-21 NOTE — Progress Notes (Signed)
F/U thyroid  Medicine refills  Complaining of chronic back pain  Requested referral for pain clinic

## 2014-08-22 LAB — TSH: TSH: 1.609 u[IU]/mL (ref 0.350–4.500)

## 2014-08-23 ENCOUNTER — Telehealth: Payer: Self-pay

## 2014-08-23 NOTE — Telephone Encounter (Signed)
Patient not available Left message on voice mail to return our call 

## 2014-08-23 NOTE — Telephone Encounter (Signed)
-----   Message from Lorayne Marek, MD sent at 08/22/2014  9:25 AM EST ----- Call and let the patient know that her TSH level is in normal range continue with current dose of levothyroxine.

## 2014-09-20 ENCOUNTER — Emergency Department (HOSPITAL_COMMUNITY): Payer: Medicaid Other

## 2014-09-20 ENCOUNTER — Encounter (HOSPITAL_COMMUNITY): Payer: Self-pay | Admitting: Emergency Medicine

## 2014-09-20 ENCOUNTER — Emergency Department (HOSPITAL_COMMUNITY)
Admission: EM | Admit: 2014-09-20 | Discharge: 2014-09-20 | Disposition: A | Discharge status: Home / Self Care | Payer: Medicaid Other | Attending: Emergency Medicine | Admitting: Emergency Medicine

## 2014-09-20 DIAGNOSIS — J069 Acute upper respiratory infection, unspecified: Secondary | ICD-10-CM | POA: Insufficient documentation

## 2014-09-20 DIAGNOSIS — Z79899 Other long term (current) drug therapy: Secondary | ICD-10-CM | POA: Insufficient documentation

## 2014-09-20 DIAGNOSIS — E039 Hypothyroidism, unspecified: Secondary | ICD-10-CM | POA: Diagnosis not present

## 2014-09-20 DIAGNOSIS — Z87891 Personal history of nicotine dependence: Secondary | ICD-10-CM | POA: Diagnosis not present

## 2014-09-20 DIAGNOSIS — Z8719 Personal history of other diseases of the digestive system: Secondary | ICD-10-CM | POA: Insufficient documentation

## 2014-09-20 DIAGNOSIS — Z86018 Personal history of other benign neoplasm: Secondary | ICD-10-CM | POA: Insufficient documentation

## 2014-09-20 DIAGNOSIS — Z7951 Long term (current) use of inhaled steroids: Secondary | ICD-10-CM | POA: Diagnosis not present

## 2014-09-20 DIAGNOSIS — R42 Dizziness and giddiness: Secondary | ICD-10-CM | POA: Insufficient documentation

## 2014-09-20 DIAGNOSIS — Z7982 Long term (current) use of aspirin: Secondary | ICD-10-CM | POA: Insufficient documentation

## 2014-09-20 DIAGNOSIS — Z85118 Personal history of other malignant neoplasm of bronchus and lung: Secondary | ICD-10-CM | POA: Insufficient documentation

## 2014-09-20 DIAGNOSIS — R110 Nausea: Secondary | ICD-10-CM | POA: Insufficient documentation

## 2014-09-20 LAB — CBC WITH DIFFERENTIAL/PLATELET
BASOS PCT: 0 % (ref 0–1)
Basophils Absolute: 0 10*3/uL (ref 0.0–0.1)
EOS ABS: 0.3 10*3/uL (ref 0.0–0.7)
Eosinophils Relative: 4 % (ref 0–5)
HCT: 37.4 % (ref 36.0–46.0)
Hemoglobin: 11.7 g/dL — ABNORMAL LOW (ref 12.0–15.0)
Lymphocytes Relative: 22 % (ref 12–46)
Lymphs Abs: 1.7 10*3/uL (ref 0.7–4.0)
MCH: 28.3 pg (ref 26.0–34.0)
MCHC: 31.3 g/dL (ref 30.0–36.0)
MCV: 90.3 fL (ref 78.0–100.0)
Monocytes Absolute: 0.5 10*3/uL (ref 0.1–1.0)
Monocytes Relative: 6 % (ref 3–12)
NEUTROS ABS: 5.2 10*3/uL (ref 1.7–7.7)
NEUTROS PCT: 68 % (ref 43–77)
PLATELETS: 404 10*3/uL — AB (ref 150–400)
RBC: 4.14 MIL/uL (ref 3.87–5.11)
RDW: 13.8 % (ref 11.5–15.5)
WBC: 7.7 10*3/uL (ref 4.0–10.5)

## 2014-09-20 LAB — COMPREHENSIVE METABOLIC PANEL
ALT: 18 U/L (ref 0–35)
ANION GAP: 7 (ref 5–15)
AST: 27 U/L (ref 0–37)
Albumin: 4 g/dL (ref 3.5–5.2)
Alkaline Phosphatase: 65 U/L (ref 39–117)
BUN: 14 mg/dL (ref 6–23)
CO2: 26 mmol/L (ref 19–32)
Calcium: 9.1 mg/dL (ref 8.4–10.5)
Chloride: 104 mEq/L (ref 96–112)
Creatinine, Ser: 0.49 mg/dL — ABNORMAL LOW (ref 0.50–1.10)
GFR calc Af Amer: >90 mL/min (ref >90)
Glucose, Bld: 85 mg/dL (ref 70–99)
Potassium: 4.3 mmol/L (ref 3.5–5.1)
SODIUM: 137 mmol/L (ref 135–145)
Total Bilirubin: 0.4 mg/dL (ref 0.3–1.2)
Total Protein: 7.7 g/dL (ref 6.0–8.3)

## 2014-09-20 MED ORDER — MECLIZINE HCL 25 MG PO TABS
25.0000 mg | ORAL_TABLET | Freq: Once | ORAL | Status: AC
  Administered 2014-09-20: 25 mg via ORAL
  Filled 2014-09-20: qty 1

## 2014-09-20 MED ORDER — ONDANSETRON HCL 4 MG/2ML IJ SOLN
4.0000 mg | Freq: Once | INTRAMUSCULAR | Status: DC
  Filled 2014-09-20: qty 2

## 2014-09-20 MED ORDER — DIAZEPAM 5 MG/ML IJ SOLN
5.0000 mg | Freq: Once | INTRAMUSCULAR | Status: AC
  Administered 2014-09-20: 5 mg via INTRAVENOUS
  Filled 2014-09-20: qty 2

## 2014-09-20 MED ORDER — MECLIZINE HCL 25 MG PO TABS: 25.0000 mg | ORAL_TABLET | Freq: Once | ORAL | Status: DC

## 2014-09-20 MED ORDER — DIAZEPAM 5 MG PO TABS: 5.0000 mg | ORAL_TABLET | Freq: Four times a day (QID) | ORAL | Status: DC | PRN

## 2014-09-20 MED ORDER — SODIUM CHLORIDE 0.9 % IV BOLUS (SEPSIS)
1000.0000 mL | Freq: Once | INTRAVENOUS | Status: AC
  Administered 2014-09-20: 1000 mL via INTRAVENOUS

## 2014-09-20 NOTE — ED Notes (Signed)
MD at bedside. Took over care of patient

## 2014-09-20 NOTE — ED Provider Notes (Signed)
CSN: [id number]     Arrival date & time 09/20/14  1059 History   First MD Initiated Contact with Patient 09/20/14 1115     Chief Complaint  Patient presents with  . Dizziness     (Consider location/radiation/quality/duration/timing/severity/associated sxs/prior Treatment) HPI  59 year old female presents with intermittent dizziness like vertigo that she had one year ago for the past 2 days. Patient has been having upper respiratory infections with sinus congestion, rhinorrhea, and cough for the past 3-4 days. No fevers, no shortness of breath, and no headaches. She is photophobic but denies blurry vision. Has been having nausea, no vomiting. Tried over-the-counter medicines for seasickness but did not help. Patient feels okay when she is lying down still and can see and read without dizziness. Movements, especially sitting up exacerbate her dizziness and cause him to recur. Feels off balance but has not fallen. No focal weakness or numbness.  Past Medical History  Diagnosis Date  . 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 Rx Carboplatinum/etoposide X 6, Topotecan X 2, Radiation  . 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 Acquired hypothyroidism 04/24/2012  . 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 H/O chronic ulcerative colitis 04/24/2012    Mild   . Depression with anxiety 04/24/2012  . Vertigo   . Anxiety    Past Surgical History  Procedure Laterality Date  . Laproscopy    . Abdominal hysterectomy     No family history on file. History  Substance Use Topics  . Smoking status: Former Smoker    Quit date: 09/18/1999  . Smokeless tobacco: Never Used  . Alcohol Use: No   OB History    No data available     Review of Systems  Constitutional: Negative for fever.  HENT: Positive for congestion.   Eyes: Positive for photophobia. Negative for visual disturbance.  Respiratory: Positive  for cough. Negative for shortness of breath.   Gastrointestinal: Positive for nausea. Negative for vomiting and abdominal pain.  Genitourinary: Negative for dysuria.  Neurological: Positive for dizziness. Negative for weakness, numbness and headaches.  All other systems reviewed and are negative.     Allergies  Review of patient's allergies indicates no known allergies.  Home Medications   Prior to Admission medications   Medication Sig Start Date End Date Taking? Authorizing Provider  aspirin EC 81 MG tablet Take 81 mg by mouth daily.    Yes Historical Provider, MD  cyclobenzaprine (FLEXERIL) 10 MG tablet Take 1 tablet (10 mg total) by mouth at bedtime. 08/21/14  Yes Lorayne Marek, MD  FLUoxetine (PROZAC) 40 MG capsule Take 40 mg by mouth daily.   Yes Historical Provider, MD  fluticasone (FLONASE) 50 MCG/ACT nasal spray Place 2 sprays into both nostrils daily. 01/17/14  Yes Lorayne Marek, MD  levothyroxine (SYNTHROID, LEVOTHROID) 50 MCG tablet Take 1 tablet (50 mcg total) by mouth daily before breakfast. 08/21/14  Yes Lorayne Marek, MD  Meclizine HCl (BONINE PO) Take 1 tablet by mouth every 6 (six) hours as needed (dizziness).   Yes Historical Provider, MD  Multiple Vitamin (MULTIVITAMIN WITH MINERALS) TABS Take 1 tablet by mouth daily.   Yes Historical Provider, MD  naproxen (NAPROSYN) 500 MG tablet Take 1 tablet (500 mg total) by mouth 2 (two) times daily as needed. 01/16/13  Yes Hannah Muthersbaugh, PA-C  Armodafinil 150 MG tablet Take 1 tablet (150 mg total) by mouth daily. Patient not taking:  Reported on 09/20/2014 08/21/14   Lorayne Marek, MD   BP 131/80 mmHg  Pulse 85  Temp(Src) 97.8 F (36.6 C) (Oral)  Resp 18  SpO2 100% Physical Exam  Constitutional: She is oriented to person, place, and time. She appears well-developed and well-nourished.  HENT:  Head: Normocephalic and atraumatic.  Right Ear: Tympanic membrane and external ear normal.  Left Ear: Tympanic membrane and  external ear normal.  Nose: Nose normal.  Eyes: EOM are normal. Pupils are equal, round, and reactive to light. Right eye exhibits no discharge. Left eye exhibits no discharge.  Neck: Neck supple.  Cardiovascular: Normal rate, regular rhythm and normal heart sounds.   Pulmonary/Chest: Effort normal and breath sounds normal.  Abdominal: Soft. She exhibits no distension. There is no tenderness.  Neurological: She is alert and oriented to person, place, and time.  CN 2-12 grossly intact. 5/5 strength in all 4 extremities. Normal finger to nose  Skin: Skin is warm and dry.  Vitals reviewed.   ED Course  Procedures (including critical care time) Labs Review Labs Reviewed  COMPREHENSIVE METABOLIC PANEL - Abnormal; Notable for the following:    Creatinine, Ser 0.49 (*)    All other components within normal limits  CBC WITH DIFFERENTIAL - Abnormal; Notable for the following:    Hemoglobin 11.7 (*)    Platelets 404 (*)    All other components within normal limits    Imaging Review Ct Head Wo Contrast  09/20/2014   CLINICAL DATA:  Initial encounter for 1 day history of dizziness.  EXAM: CT HEAD WITHOUT CONTRAST  TECHNIQUE: Contiguous axial images were obtained from the base of the skull through the vertex without intravenous contrast.  COMPARISON:  05/10/2012  FINDINGS: There is no evidence for acute hemorrhage, hydrocephalus, mass lesion, or abnormal extra-axial fluid collection. No definite CT evidence for acute infarction. Diffuse loss of parenchymal volume is consistent with atrophy. Patchy low attenuation in the deep hemispheric and periventricular white matter is nonspecific, but likely reflects chronic microvascular ischemic demyelination. The visualized paranasal sinuses and mastoid air cells are clear.  IMPRESSION: Stable exam.  No acute intracranial abnormality.  Atrophy with chronic small vessel white matter ischemic demyelination.   Electronically Signed   By: Misty Stanley M.D.   On:  09/20/2014 12:23   Dg Chest Port 1 View  09/20/2014   CLINICAL DATA:  Prior history of small cell carcinoma of the right lung in 2006 for which she underwent radiation therapy. Patient presents today with complaints of acute dizziness, cough and chest congestion. Former smoker.  EXAM: PORTABLE CHEST - 1 VIEW  COMPARISON:  Two-view chest x-ray 10/03/2012 dating back to 12/16/2009. CT chest 07/04/2011.  FINDINGS: Cardiac silhouette normal in size, unchanged. Thoracic aorta mildly tortuous, unchanged. Post radiation fibrosis in the medial right lung in the upper lobe and superior segment lower lobe, unchanged. Lungs otherwise clear. No localized airspace consolidation. No pleural effusions. No pneumothorax. Normal pulmonary vascularity.  IMPRESSION: No acute cardiopulmonary disease. Stable post radiation fibrosis in the medial right lung.   Electronically Signed   By: Evangeline Dakin M.D.   On: 09/20/2014 15:59     EKG Interpretation None      MDM   Final diagnoses:  Vertigo  Upper respiratory infection    Patient's vertigo symptoms improved significantly with IV Valium and then oral meclizine. She is now able to get up and ambulate. She walks a straight line as long as she is walking slowly. She is now  able to open her eyes and feels much improved. No headaches or focal neurologic signs. Given her prior history of radiation to her head for cancer, a CT was obtained and is negative. With her recent upper rest for infections I feel this is likely a peripheral vertigo. I did discuss the possibility of an MRI but patient declines at this time she feels better to go home. Stroke is less likely. Will discharge with Valium and have recommended that if her symptoms do not improve or recur significantly or other concerning signs she's returned to the ER immediately.    Ephraim Hamburger, MD 09/20/14 [phone]

## 2014-09-20 NOTE — ED Notes (Signed)
Pt w/ extensive hx of vertigo c/o dizziness since yesterday.  Ran out of meclizine.  States that she tried to go to [facility] but they were closed.

## 2014-09-23 ENCOUNTER — Telehealth: Payer: Self-pay

## 2014-09-23 ENCOUNTER — Telehealth: Payer: Self-pay | Admitting: Internal Medicine

## 2014-09-23 NOTE — Telephone Encounter (Signed)
Patient is concerned that she is not getting the prescription that she was prescribed by her PCP, Please f/u with pt.

## 2014-09-23 NOTE — Telephone Encounter (Signed)
Returned patient phone call Patient not available Left message on voice mail to return our call 

## 2014-09-24 ENCOUNTER — Telehealth: Payer: Self-pay

## 2014-09-24 NOTE — Telephone Encounter (Signed)
Returned patient phone call Patient not available Left message on voice mail to return our call 

## 2014-09-24 NOTE — Telephone Encounter (Signed)
Left voice message to return call 

## 2014-09-25 NOTE — Telephone Encounter (Signed)
Left voice message to return call 

## 2014-09-25 NOTE — Telephone Encounter (Signed)
Pt returning nurse's call. Please f/u with pt.  °

## 2014-09-26 NOTE — Telephone Encounter (Signed)
Pt stated want to talk to someone in the pharmacy

## 2014-10-10 ENCOUNTER — Telehealth: Payer: Self-pay | Admitting: Internal Medicine

## 2014-10-10 NOTE — Telephone Encounter (Signed)
Pt calling to speak to nurse regarding scripts, please f/u with pt.

## 2014-10-24 ENCOUNTER — Telehealth: Payer: Self-pay | Admitting: Internal Medicine

## 2014-10-24 ENCOUNTER — Telehealth: Payer: Self-pay

## 2014-10-24 DIAGNOSIS — G4710 Hypersomnia, unspecified: Secondary | ICD-10-CM

## 2014-10-24 NOTE — Telephone Encounter (Signed)
Returned patient phone call Patient not available Left message on machine to return our call  

## 2014-10-24 NOTE — Telephone Encounter (Signed)
Patient has come in to inquire about medication for nuvagil? ; patient states that she gets medication mailed to her; please f/u with patient

## 2014-10-24 NOTE — Telephone Encounter (Signed)
Pt requesting refill Rx nuvigil

## 2014-10-24 NOTE — Telephone Encounter (Signed)
Patient has come in today to see if she can have a referral for Gertie Fey; patient had an original referral however medicaid PCP was listed as [facility] instead of [facility]; patient was given form to change PCP back to [facility] but will need another referral entered to reschedule; please f/u with patient

## 2014-10-25 MED ORDER — ARMODAFINIL 200 MG PO TABS: 1.0000 | ORAL_TABLET | Freq: Every day | ORAL | Status: DC

## 2014-10-25 NOTE — Telephone Encounter (Signed)
Rx Refilled  

## 2014-10-25 NOTE — Addendum Note (Signed)
Addended by: Betti Cruz on: 10/25/2014 02:05 PM   Modules accepted: Orders

## 2014-10-25 NOTE — Telephone Encounter (Signed)
If patient is due for refill, she can be given medication refill

## 2014-10-28 ENCOUNTER — Telehealth: Payer: Self-pay | Admitting: Internal Medicine

## 2014-10-28 NOTE — Telephone Encounter (Signed)
Patient called about rx status, wants nurse to call her back . Please f/u with patient.

## 2014-11-14 ENCOUNTER — Telehealth: Payer: Self-pay

## 2014-11-14 NOTE — Telephone Encounter (Signed)
Pt called, returning nurse's phone call. Please f/u with pt

## 2014-11-14 NOTE — Telephone Encounter (Signed)
Returned patient phone call Patient not available Left message on voice mail to return our call 

## 2014-11-18 ENCOUNTER — Telehealth: Payer: Self-pay

## 2014-11-18 NOTE — Telephone Encounter (Signed)
Patient calling about a refill on her nuvigil Prescription was refilled on 10/25/14 per  Junious Dresser  Patient states the pharmacy does not have the medication request Call transferred to pharmacy

## 2014-11-19 NOTE — Telephone Encounter (Signed)
Left message Rx in front office ready to be pick up

## 2014-12-31 ENCOUNTER — Ambulatory Visit: Payer: Medicaid Other | Admitting: Internal Medicine

## 2015-01-07 ENCOUNTER — Ambulatory Visit: Payer: Medicaid Other | Attending: Internal Medicine | Admitting: Internal Medicine

## 2015-01-07 ENCOUNTER — Encounter: Payer: Self-pay | Admitting: Internal Medicine

## 2015-01-07 VITALS — BP 117/83 | HR 87 | Temp 98.8°F | Resp 16 | Wt 184.6 lb

## 2015-01-07 DIAGNOSIS — E039 Hypothyroidism, unspecified: Secondary | ICD-10-CM | POA: Insufficient documentation

## 2015-01-07 DIAGNOSIS — Z923 Personal history of irradiation: Secondary | ICD-10-CM | POA: Diagnosis not present

## 2015-01-07 DIAGNOSIS — Z85118 Personal history of other malignant neoplasm of bronchus and lung: Secondary | ICD-10-CM | POA: Diagnosis not present

## 2015-01-07 DIAGNOSIS — Z7951 Long term (current) use of inhaled steroids: Secondary | ICD-10-CM | POA: Diagnosis not present

## 2015-01-07 DIAGNOSIS — Z1211 Encounter for screening for malignant neoplasm of colon: Secondary | ICD-10-CM | POA: Diagnosis not present

## 2015-01-07 DIAGNOSIS — Z87891 Personal history of nicotine dependence: Secondary | ICD-10-CM | POA: Insufficient documentation

## 2015-01-07 DIAGNOSIS — Z90721 Acquired absence of ovaries, unilateral: Secondary | ICD-10-CM | POA: Diagnosis not present

## 2015-01-07 DIAGNOSIS — Z7982 Long term (current) use of aspirin: Secondary | ICD-10-CM | POA: Insufficient documentation

## 2015-01-07 DIAGNOSIS — G4710 Hypersomnia, unspecified: Secondary | ICD-10-CM | POA: Diagnosis not present

## 2015-01-07 DIAGNOSIS — Z9221 Personal history of antineoplastic chemotherapy: Secondary | ICD-10-CM | POA: Insufficient documentation

## 2015-01-07 DIAGNOSIS — Z90710 Acquired absence of both cervix and uterus: Secondary | ICD-10-CM | POA: Diagnosis not present

## 2015-01-07 DIAGNOSIS — F418 Other specified anxiety disorders: Secondary | ICD-10-CM | POA: Diagnosis not present

## 2015-01-07 LAB — TSH: TSH: 1.509 u[IU]/mL (ref 0.350–4.500)

## 2015-01-07 MED ORDER — LEVOTHYROXINE SODIUM 50 MCG PO TABS: 50.0000 ug | ORAL_TABLET | Freq: Every day | ORAL | Status: DC

## 2015-01-07 NOTE — Progress Notes (Signed)
MRN: [id number] Name: Stacy [name]  Sex: female Age: 59 y.o. DOB: 11/06/1955  Allergies: Review of patient's allergies indicates no known allergies.  Chief Complaint  Patient presents with  . Follow-up    HPI: Patient is 59 y.o. female who has history of hypothyroidism, chronic lower back pain currently following up with pain management, she also history of small cell lung cancer as per patient she underwent chemotherapy and at that time had been radiation done after that patient has been having the problem with excessive sleepiness, as per patient she was prescribed Nuvigil by her oncologist as per patient it helps her to keep her awake, patient has not been evaluated by neurologist , patient not sure if she was diagnosed with narcolepsy at that time.  Past Medical History  Diagnosis Date  . 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 Rx Carboplatinum/etoposide X 6, Topotecan X 2, Radiation  . 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 Acquired hypothyroidism 04/24/2012  . 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 H/O chronic ulcerative colitis 04/24/2012    Mild   . Depression with anxiety 04/24/2012  . Vertigo   . Anxiety     Past Surgical History  Procedure Laterality Date  . Laproscopy    . Abdominal hysterectomy        Medication List       This list is accurate as of: 01/07/15  1:09 PM.  Always use your most recent med list.               Armodafinil 200 MG Tabs  Commonly known as:  NUVIGIL  Take 1 tablet by mouth daily.     aspirin EC 81 MG tablet  Take 81 mg by mouth daily.     BONINE PO  Take 1 tablet by mouth every 6 (six) hours as needed (dizziness).     cyclobenzaprine 10 MG tablet  Commonly known as:  FLEXERIL  Take 1 tablet (10 mg total) by mouth at bedtime.     diazepam 5 MG tablet  Commonly known as:  VALIUM  Take 1 tablet (5 mg total) by mouth  every 6 (six) hours as needed (dizziness).     FLUoxetine 40 MG capsule  Commonly known as:  PROZAC  Take 40 mg by mouth daily.     fluticasone 50 MCG/ACT nasal spray  Commonly known as:  FLONASE  Place 2 sprays into both nostrils daily.     levothyroxine 50 MCG tablet  Commonly known as:  SYNTHROID, LEVOTHROID  Take 1 tablet (50 mcg total) by mouth daily before breakfast.     multivitamin with minerals Tabs tablet  Take 1 tablet by mouth daily.     naproxen 500 MG tablet  Commonly known as:  NAPROSYN  Take 1 tablet (500 mg total) by mouth 2 (two) times daily as needed.        Meds ordered this encounter  Medications  . levothyroxine (SYNTHROID, LEVOTHROID) 50 MCG tablet    Sig: Take 1 tablet (50 mcg total) by mouth daily before breakfast.    Dispense:  30 tablet    Refill:  3    Immunization History  Administered Date(s) Administered  . Influenza,inj,Quad PF,36+ Mos 08/21/2014  . Pneumococcal Polysaccharide-23 08/21/2014    History reviewed. No pertinent family history.  History  Substance Use Topics  . Smoking status: Former Smoker  Quit date: 09/18/1999  . Smokeless tobacco: Never Used  . Alcohol Use: No    Review of Systems   As noted in HPI  Filed Vitals:   01/07/15 1228  BP: 117/83  Pulse: 87  Temp: 98.8 F (37.1 C)  Resp: 16    Physical Exam  Physical Exam  Constitutional: No distress.  Eyes: EOM are normal. Pupils are equal, round, and reactive to light.  Cardiovascular: Normal rate and regular rhythm.   Pulmonary/Chest: Breath sounds normal. No respiratory distress. She has no wheezes. She has no rales.  Musculoskeletal: She exhibits no edema.    CBC    Component Value Date/Time   WBC 7.7 09/20/2014 1238   WBC 10.1 11/23/2013 1604   RBC 4.14 09/20/2014 1238   RBC 4.50 11/23/2013 1604   HGB 11.7* 09/20/2014 1238   HGB 13.0 11/23/2013 1604   HCT 37.4 09/20/2014 1238   HCT 39.7 11/23/2013 1604   PLT 404* 09/20/2014 1238   PLT  362 11/23/2013 1604   MCV 90.3 09/20/2014 1238   MCV 88.2 11/23/2013 1604   LYMPHSABS 1.7 09/20/2014 1238   LYMPHSABS 2.0 11/23/2013 1604   MONOABS 0.5 09/20/2014 1238   MONOABS 0.7 11/23/2013 1604   EOSABS 0.3 09/20/2014 1238   EOSABS 0.4 11/23/2013 1604   BASOSABS 0.0 09/20/2014 1238   BASOSABS 0.1 11/23/2013 1604    CMP     Component Value Date/Time   NA 137 09/20/2014 1238   NA 138 11/23/2013 1604   K 4.3 09/20/2014 1238   K 3.9 11/23/2013 1604   CL 104 09/20/2014 1238   CL 104 11/17/2012 1235   CO2 26 09/20/2014 1238   CO2 23 11/23/2013 1604   GLUCOSE 85 09/20/2014 1238   GLUCOSE 94 11/23/2013 1604   GLUCOSE 95 11/17/2012 1235   BUN 14 09/20/2014 1238   BUN 16.9 11/23/2013 1604   CREATININE 0.49* 09/20/2014 1238   CREATININE 0.6 11/23/2013 1604   CREATININE 0.59 11/20/2013 0947   CALCIUM 9.1 09/20/2014 1238   CALCIUM 10.2 11/23/2013 1604   PROT 7.7 09/20/2014 1238   PROT 8.5* 11/23/2013 1604   ALBUMIN 4.0 09/20/2014 1238   ALBUMIN 4.5 11/23/2013 1604   AST 27 09/20/2014 1238   AST 20 11/23/2013 1604   ALT 18 09/20/2014 1238   ALT 22 11/23/2013 1604   ALKPHOS 65 09/20/2014 1238   ALKPHOS 69 11/23/2013 1604   BILITOT 0.4 09/20/2014 1238   BILITOT <0.20 11/23/2013 1604   GFRNONAA >90 09/20/2014 1238   GFRNONAA >89 11/20/2013 0947   GFRAA >90 09/20/2014 1238   GFRAA >89 11/20/2013 0947    Lab Results  Component Value Date/Time   CHOL 187 11/20/2013 09:47 AM    Lab Results  Component Value Date/Time   HGBA1C 5.6 03/30/2013 06:30 PM    Lab Results  Component Value Date/Time   AST 27 09/20/2014 12:38 PM   AST 20 11/23/2013 04:04 PM    Assessment and Plan  Hypothyroidism, unspecified hypothyroidism type - Plan: patient is currently on , LEVOTHROID) 50 MCG tablet,will repeat her SH level.  Excessive sleepiness - Plan: Ambulatory referral to Neurology  Special screening for malignant neoplasms, colon - Plan: Ambulatory referral to  Gastroenterology   Health Maintenance -Colonoscopy:referred to GI -Mammogram:up-to-date    Return in about 3 months (around 04/08/2015) for hypothyroid.   This note has been created with Surveyor, quantity. Any transcriptional errors are unintentional.    Lorayne Marek, MD

## 2015-01-07 NOTE — Progress Notes (Signed)
Patient here for follow up on her thyroid disease and cholesterol Patient now has insurance and medicaid needs a diagnosis on her nuvigal prescription in order for it to be covered

## 2015-01-08 ENCOUNTER — Telehealth: Payer: Self-pay

## 2015-01-08 NOTE — Telephone Encounter (Signed)
Patient not available Left message on voice mail to return our call 

## 2015-01-08 NOTE — Telephone Encounter (Signed)
-----   Message from Lorayne Marek, MD sent at 01/08/2015 10:32 AM EDT ----- Call and let the  patient know that her TSH level is in normal range, continue with current dose of levothyroxine.

## 2015-01-10 NOTE — Progress Notes (Unsigned)
Prior authorization request received for Nuvigil 200 mg daily.  Prior authorization request form initiated; awaiting completion by Dr. Annitta Needs.  When completed form will need to be faxed to [facility] [phone]).

## 2015-02-04 ENCOUNTER — Encounter: Payer: Self-pay | Admitting: Neurology

## 2015-02-04 ENCOUNTER — Ambulatory Visit (INDEPENDENT_AMBULATORY_CARE_PROVIDER_SITE_OTHER): Payer: Medicaid Other | Admitting: Neurology

## 2015-02-04 VITALS — BP 116/72 | HR 72 | Resp 16 | Ht 64.0 in | Wt 192.0 lb

## 2015-02-04 DIAGNOSIS — G4710 Hypersomnia, unspecified: Secondary | ICD-10-CM | POA: Diagnosis not present

## 2015-02-04 NOTE — Patient Instructions (Addendum)
We will do further testing with night time sleep testing and daytime sleep testing with naps.  You would have to come off of clonazepam and hydrocodone for 10-14 days prior to sleep testing. As discussed, you can continue with your prozac.   Our sleep lab administrative assistant, Angelina Sheriff will call you to schedule your sleep study. If you don't hear back from her by next week please feel free to call her at [phone]. This is her direct line and please leave a message with your phone number to call back if you get the voicemail box.

## 2015-02-04 NOTE — Progress Notes (Signed)
Subjective:    Patient ID: Stacy [name] is a 59 y.o. female.  HPI     Star Age, MD, PhD [facility] [address], [address] [place], [place] [address]  Dear Dr. Annitta Needs,   I saw your patient, Stacy [name], upon your kind request in my neurologic clinic today for initial consultation of her sleep disorder, in particular, her history of hypersomnolence. The patient is unaccompanied today. As you know, Stacy [name] is a 59 year old right-handed woman with an underlying medical history of obesity, hypothyroidism, chronic low back pain, for which she follows with pain management (on hydrocodone 5 mg bid), small cell lung cancer, status post chemotherapy and radiation therapy, who reports excessive daytime somnolence. She had a prescription for Nuvigil which was provided by her oncologist which helped her sleepiness. She has never had a sleep study. She reports taking prophylactic brain radiation in 2000. Nuvigil helped her sleepiness. She has been on provigil and then tried [employer]. Her insurance did not cover the Nuvigil any longer. She does not snore. She grinds her teeth. She goes to bed around 11 PM. Her rise time is 7:30 AM. She denies cataplexy. She has had sleep paralysis in the past. She denies any hypnagogic or hypnopompic hallucinations. She does have occasional vivid dreams. Her Epworth sleepiness score is 11 out of 24 today. Her fatigue score is 43 out of 63 today. She does not always feel rested when she first wakes up. She denies morning headaches or nocturia. She denies a family history of narcolepsy. She has a history of vertigo. She works part-time in [employer] (medical). She works typically between 27-35 hours weekly. She has not fallen asleep driving. She does not fall asleep at work. She denies any significant restless leg symptoms. She may have occasional snoring. She lives with her 2 sons, ages 69 and 91. She is divorced. She drinks alcohol occasionally.  She quit smoking in 2000. She drinks tea, 2 cups a day.   Her Past Medical History Is Significant For: Past Medical History  Diagnosis Date  . 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 Rx Carboplatinum/etoposide X 6, Topotecan X 2, Radiation  . 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 Acquired hypothyroidism 04/24/2012  . 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 H/O chronic ulcerative colitis 04/24/2012    Mild   . Depression with anxiety 04/24/2012  . Vertigo   . Anxiety    Her Past Surgical History Is Significant For: Past Surgical History  Procedure Laterality Date  . Laproscopy    . Abdominal hysterectomy      Her Family History Is Significant For: Family History  Problem Relation Age of Onset  . Hypertension Mother   . High Cholesterol Mother   . Prostate cancer Father     Her Social History Is Significant For: History   Social History  . Marital Status: Divorced    Spouse Name: N/A  . Number of Children: 2  . Years of Education: BA   Occupational History  . [employer]    Social History Main Topics  . Smoking status: Former Smoker    Quit date: 09/18/1999  . Smokeless tobacco: Never Used  . Alcohol Use: 0.0 oz/week    0 Standard drinks or equivalent per week     Comment: Ocasional   . Drug Use: No  . Sexual Activity: No   Other Topics Concern  .  None   Social History Narrative   Drinks 2 cups of tea a day     Her Allergies Are:  No Known Allergies:   Her Current Medications Are:  Outpatient Encounter Prescriptions as of 02/04/2015  Medication Sig  . aspirin EC 81 MG tablet Take 81 mg by mouth daily.   . clonazePAM (KLONOPIN) 0.5 MG tablet Take 0.5 mg by mouth 2 (two) times daily as needed for anxiety.  Marland Kitchen FLUoxetine (PROZAC) 40 MG capsule Take 40 mg by mouth daily.  Marland Kitchen HYDROcodone-acetaminophen (NORCO/VICODIN) 5-325 MG per tablet Take 1 tablet by mouth every 6  (six) hours as needed for moderate pain.  Marland Kitchen levothyroxine (SYNTHROID, LEVOTHROID) 50 MCG tablet Take 1 tablet (50 mcg total) by mouth daily before breakfast.  . Multiple Vitamin (MULTIVITAMIN WITH MINERALS) TABS Take 1 tablet by mouth daily.  . Omega-3 Fatty Acids (FISH OIL) 1000 MG CAPS Take by mouth.  . Armodafinil (NUVIGIL) 200 MG TABS Take 1 tablet by mouth daily. (Patient not taking: Reported on 02/04/2015)  . [DISCONTINUED] cyclobenzaprine (FLEXERIL) 10 MG tablet Take 1 tablet (10 mg total) by mouth at bedtime.  . [DISCONTINUED] diazepam (VALIUM) 5 MG tablet Take 1 tablet (5 mg total) by mouth every 6 (six) hours as needed (dizziness).  . [DISCONTINUED] fluticasone (FLONASE) 50 MCG/ACT nasal spray Place 2 sprays into both nostrils daily.  . [DISCONTINUED] Meclizine HCl (BONINE PO) Take 1 tablet by mouth every 6 (six) hours as needed (dizziness).  . [DISCONTINUED] naproxen (NAPROSYN) 500 MG tablet Take 1 tablet (500 mg total) by mouth 2 (two) times daily as needed.   No facility-administered encounter medications on file as of 02/04/2015.  :  Review of Systems:  Out of a complete 14 point review of systems, all are reviewed and negative with the exception of these symptoms as listed below:   Review of Systems  Constitutional: Positive for fatigue.       Weight gain   HENT: Positive for tinnitus.        Vertigo   Gastrointestinal: Positive for constipation.  Neurological: Positive for numbness.       Daytime sleepiness, Vertigo, trouble falling asleep at night (changing work schedule), trouble staying asleep, snore, grinds teeth at night, sometimes wakes up feeling tired, falls asleep during day, does not take naps due to work.     Objective:  Neurologic Exam  Physical Exam Physical Examination:   Filed Vitals:   02/04/15 1341  BP: 116/72  Pulse: 72  Resp: 16    General Examination: The patient is a very pleasant 59 y.o. female in no acute distress. She appears  well-developed and well-nourished and well groomed.   HEENT: Normocephalic, atraumatic, pupils are equal, round and reactive to light and accommodation. Funduscopic exam is normal with sharp disc margins noted. Extraocular tracking is good without limitation to gaze excursion or nystagmus noted. Normal smooth pursuit is noted. Hearing is grossly intact. Tympanic membranes are clear bilaterally. Face is symmetric with normal facial animation and normal facial sensation. Speech is clear with no dysarthria noted. There is no hypophonia. There is no lip, neck/head, jaw or voice tremor. Neck is supple with full range of passive and active motion. There are no carotid bruits on auscultation. Oropharynx exam reveals: mild mouth dryness, adequate dental hygiene and moderate airway crowding, due to narrow airway entry and tonsils in place. Mallampati is class II. Tongue protrudes centrally and palate elevates symmetrically. Tonsils are 2+ on the right and 1+ on the left.  Left tonsil is cryptic in appearance. Is 15-5/8 inches.  Chest: Clear to auscultation without wheezing, rhonchi or crackles noted.  Heart: S1+S2+0, regular and normal without murmurs, rubs or gallops noted.   Abdomen: Soft, non-tender and non-distended with normal bowel sounds appreciated on auscultation.  Extremities: There is no pitting edema in the distal lower extremities bilaterally. Pedal pulses are intact.  Skin: Warm and dry without trophic changes noted. There are no varicose veins.  Musculoskeletal: exam reveals no obvious joint deformities, tenderness or joint swelling or erythema.   Neurologically:  Mental status: The patient is awake, alert and oriented in all 4 spheres. Her immediate and remote memory, attention, language skills and fund of knowledge are appropriate. There is no evidence of aphasia, agnosia, apraxia or anomia. Speech is clear with normal prosody and enunciation. Thought process is linear. Mood is normal and  affect is normal.  Cranial nerves II - XII are as described above under HEENT exam. In addition: shoulder shrug is normal with equal shoulder height noted. Motor exam: Normal bulk, strength and tone is noted. There is no drift, tremor or rebound. Romberg is negative. Reflexes are 2+ throughout. Babinski: Toes are flexor bilaterally. Fine motor skills and coordination: intact with normal finger taps, normal hand movements, normal rapid alternating patting, normal foot taps and normal foot agility.  Cerebellar testing: No dysmetria or intention tremor on finger to nose testing. Heel to shin is unremarkable bilaterally. There is no truncal or gait ataxia.  Sensory exam: intact to light touch, pinprick, vibration, temperature sense in the upper and lower extremities.  Gait, station and balance: She stands easily. No veering to one side is noted. No leaning to one side is noted. Posture is age-appropriate and stance is narrow based. Gait shows normal stride length and normal pace. No problems turning are noted. She turns en bloc. Tandem walk is unremarkable.   Assessment and Plan:    In summary, Stacy [name] is a very pleasant 59 y.o.-year old female with an underlying medical history of obesity, hypothyroidism, chronic low back pain, for which she follows with pain management (on hydrocodone 5 mg bid), small cell lung cancer, status post chemotherapy and radiation therapy, who reports excessive daytime somnolence. Her history is not telltale for narcolepsy. She does not really endorse significant for severe excessive sleepiness with an Epworth sleepiness score of 11 out of 24 today. She does not endorse significant restless leg symptoms or underlying sleep disordered breathing. Nevertheless, we can certainly proceed with further testing to further delineate her underlying sleep disorder. She is advised that I would like to proceed with a nocturnal polysomnogram followed by a nap study to further help  quantify her daytime somnolence. In order to schedule her nap study she is advised to taper off her hydrocodone and clonazepam at least 10-14 days prior to sleep testing. She is also on Prozac which as you know is a potential REM suppressant. Nevertheless, because of her underlying mood disorder I think it is not feasible to try to taper off of her antidepressant. We will have her continue with her Prozac at this time. I will see her back after the sleep testing is completed. I am not sure if she has an organic underlying sleep disorder or hypersomnolence disorder. Daytime somnolence can also be a function of suboptimally treated mood disorder, and medication side effects. I answered all her questions today and the patient was in agreement with the above outlined plan. Thank you very much for allowing  me to participate in the care of this nice patient. If I can be of any further assistance to you please do not hesitate to call me at [phone].  Sincerely,   Jerolyn Flenniken, MD, PhD  

## 2015-02-18 ENCOUNTER — Telehealth: Payer: Self-pay | Admitting: *Deleted

## 2015-02-18 NOTE — Telephone Encounter (Signed)
VM message from patient requesting follow up appt with Dr. Julien Nordmann. She last saw Dr. Beryle Beams on November 26, 2013. His note indicated he would transition her care to Dr. Julien Nordmann.

## 2015-02-19 ENCOUNTER — Telehealth: Payer: Self-pay | Admitting: Internal Medicine

## 2015-02-19 NOTE — Telephone Encounter (Signed)
left message for patient to return call

## 2015-02-27 ENCOUNTER — Telehealth: Payer: Self-pay | Admitting: Internal Medicine

## 2015-02-27 NOTE — Telephone Encounter (Signed)
Called patient re scheduling an annual f/u appointment with MM. Former patient of [place] called in for f/u. Per patient she was to be transferred to MM. Patient was last seen by [facility] 11/26/2013. Not pof sent re next f/u and transferring patient to MM. Per 02/24/2015 response from MM he will see patient. Left message for patient with my name and direct number asking that she call me at her convenience re scheduling an appointment to see MM. HIM also made a call to patient on 02/19/15.

## 2015-02-27 NOTE — Telephone Encounter (Signed)
Called patient re scheduling an annual f/u appointment with MM. Former patient of [employer] called in for f/u. Per patient she was to be transferred to MM. Patient was last seen by [facility]

## 2015-03-11 ENCOUNTER — Ambulatory Visit: Payer: Medicaid Other | Attending: Internal Medicine

## 2015-03-18 ENCOUNTER — Encounter: Payer: Self-pay | Admitting: Family Medicine

## 2015-03-18 ENCOUNTER — Ambulatory Visit: Payer: Self-pay | Attending: Family Medicine | Admitting: Family Medicine

## 2015-03-18 VITALS — BP 131/84 | HR 86 | Temp 98.0°F | Resp 16 | Wt 193.0 lb

## 2015-03-18 DIAGNOSIS — H01003 Unspecified blepharitis right eye, unspecified eyelid: Secondary | ICD-10-CM | POA: Insufficient documentation

## 2015-03-18 DIAGNOSIS — E039 Hypothyroidism, unspecified: Secondary | ICD-10-CM | POA: Insufficient documentation

## 2015-03-18 MED ORDER — ERYTHROMYCIN 5 MG/GM OP OINT: 1.0000 "application " | TOPICAL_OINTMENT | Freq: Four times a day (QID) | OPHTHALMIC | Status: DC

## 2015-03-18 NOTE — Progress Notes (Signed)
Triage nurse seen patient for red, swollen right eye. Right eye had pimple under eyelid last week, eye has been red and swollen since Sunday. Patient can barely open eye today. Pain in right eye at level 7 described as throbbing.  Patients vital signs as follows: 97.7, 81, 18, 118/78, 97% O2. Patient denies drainage from  Eye. Dr. Jarold Song to see patient.

## 2015-03-18 NOTE — Progress Notes (Signed)
Patient complains of right upper lid swollen for the past three days Patient states it started out as a small "pimple" but has gotten worse over The past couple of days Patient states it  Is a little itchy but mainly hurts

## 2015-03-18 NOTE — Progress Notes (Signed)
Subjective:    Patient ID: Stacy [name], female    DOB: 1956/01/28, 59 y.o.   MRN: [id number]  HPI Stacy [name] is a 59 year old female with  A history of hypothyroidism, previous history of lung cancer who is seen today for same day visit and complains of a 3 day history of right eyelid swelling which started out as a pimple in her upper eyelid. Also noticed eyelids getting matted. Used OTC drops for 'pink eyes' with no relief in symptoms.  she denies foreign body sensation in her eyes and has had no.   denies upper respiratory symptoms and has no sinus pressure her pain.  she has had no fevers.  Past Medical History  Diagnosis Date  . 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 Rx Carboplatinum/etoposide X 6, Topotecan X 2, Radiation  . 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 Acquired hypothyroidism 04/24/2012  . 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 H/O chronic ulcerative colitis 04/24/2012    Mild   . Depression with anxiety 04/24/2012  . Vertigo   . Anxiety     Past Surgical History  Procedure Laterality Date  . Laproscopy    . Abdominal hysterectomy      Family History  Problem Relation Age of Onset  . Hypertension Mother   . High Cholesterol Mother   . Prostate cancer Father     History   Social History  . Marital Status: Divorced    Spouse Name: N/A  . Number of Children: 2  . Years of Education: BA   Occupational History  . [employer]    Social History Main Topics  . Smoking status: Former Smoker    Quit date: 09/18/1999  . Smokeless tobacco: Never Used  . Alcohol Use: 0.0 oz/week    0 Standard drinks or equivalent per week     Comment: Ocasional   . Drug Use: No  . Sexual Activity: No   Other Topics Concern  . Not on file   Social History Narrative   Drinks 2 cups of tea a day     No Known Allergies   Review of Systems  Constitutional:  Negative for activity change, appetite change and fatigue.  HENT: Negative for congestion, sinus pressure and sore throat.   Eyes: Negative for visual disturbance.       See hpi  Respiratory: Negative for cough, chest tightness, shortness of breath and wheezing.   Cardiovascular: Negative for chest pain and palpitations.  Gastrointestinal: Negative for abdominal pain, constipation and abdominal distention.  Endocrine: Negative for polydipsia.  Genitourinary: Negative for dysuria and frequency.  Musculoskeletal: Negative for back pain and arthralgias.  Skin: Negative for rash.  Neurological: Negative for tremors, light-headedness and numbness.  Hematological: Does not bruise/bleed easily.  Psychiatric/Behavioral: Negative for behavioral problems and agitation.         Objective:   Physical Exam  Constitutional: normal appearing,  Eyes:  Right upper eyelid with severe edema and erythema mild. We talked tenderness undulates but none on the underlying bony tissue. No sinus tenderness. HEENT: Head is atraumatic, normal sinuses, normal oropharynx, normal appearing tonsils and palate, tympanic membrane is normal bilaterally. Neck: normal range of motion, no thyromegaly, no JVD Cardiovascular: normal rate and rhythm, normal heart sounds, no murmurs, rub or gallop, no pedal edema Respiratory: clear to auscultation bilaterally, no wheezes, no rales, no rhonchi Abdomen: soft, not tender  to palpation, normal bowel sounds, no enlarged organs Extremities: Full ROM, no tenderness in joints        Assessment & Plan:   59 year old patient with a history of hypothyroidism, history of small cell lung cancer now presenting with right eye symptoms.    Blepharitis :  questionable underlying stye which has not responded to OTC regimens.  placed on topical antibiotic she has been  Advised to apply warm compress.   Hypothyroidism:  Continue Levothyroxine

## 2015-03-18 NOTE — Patient Instructions (Signed)
Blepharitis Blepharitis is redness, soreness, and swelling (inflammation) of one or both eyelids. It may be caused by an allergic reaction or a bacterial infection. Blepharitis may also be associated with reddened, scaly skin (seborrhea) of the scalp and eyebrows. While you sleep, eye discharge may cause your eyelashes to stick together. Your eyelids may itch, burn, swell, and may lose their lashes. These will grow back. Your eyes may become sensitive. Blepharitis may recur and need repeated treatment. If this is the case, you may require further evaluation by an eye specialist (ophthalmologist). HOME CARE INSTRUCTIONS   Keep your hands clean.  Use a clean towel each time you dry your eyelids. Do not use this towel to clean other areas. Do not share a towel or makeup with anyone.  Wash your eyelids with warm water or warm water mixed with a small amount of baby shampoo. Do this twice a day or as often as needed.  Wash your face and eyebrows at least once a day.  Use warm compresses 2 times a day for 10 minutes at a time, or as directed by your caregiver.  Apply antibiotic ointment as directed by your caregiver.  Avoid rubbing your eyes.  Avoid wearing makeup until you get better.  Follow up with your caregiver as directed. SEEK IMMEDIATE MEDICAL CARE IF:   You have pain, redness, or swelling that gets worse or spreads to other parts of your face.  Your vision changes, or you have pain when looking at lights or moving objects.  You have a fever.  Your symptoms continue for longer than 2 to 4 days or become worse. MAKE SURE YOU:   Understand these instructions.  Will watch your condition.  Will get help right away if you are not doing well or get worse. Document Released: 09/03/2000 Document Revised: 11/29/2011 Document Reviewed: 10/14/2010 Exit[employer] Patient Information 2015 [employer]. This information is not intended to replace advice given to you by your health care  provider. Make sure you discuss any questions you have with your health care provider.  

## 2015-05-08 ENCOUNTER — Other Ambulatory Visit: Payer: Self-pay | Admitting: Internal Medicine

## 2015-05-16 ENCOUNTER — Other Ambulatory Visit: Payer: Self-pay | Admitting: *Deleted

## 2015-05-16 DIAGNOSIS — E039 Hypothyroidism, unspecified: Secondary | ICD-10-CM

## 2015-05-16 MED ORDER — LEVOTHYROXINE SODIUM 50 MCG PO TABS: 50.0000 ug | ORAL_TABLET | Freq: Every day | ORAL | Status: DC

## 2015-05-20 ENCOUNTER — Telehealth: Payer: Self-pay | Admitting: Internal Medicine

## 2015-09-23 ENCOUNTER — Ambulatory Visit: Payer: Medicaid Other | Attending: Internal Medicine

## 2015-09-23 ENCOUNTER — Other Ambulatory Visit: Payer: Self-pay | Admitting: Internal Medicine

## 2015-09-23 DIAGNOSIS — E039 Hypothyroidism, unspecified: Secondary | ICD-10-CM

## 2015-09-23 NOTE — Telephone Encounter (Signed)
Patient came in requesting a medication refill for SYNTHROID, Patient has appt 1/10. Please follow up with patient.

## 2015-09-24 MED ORDER — LEVOTHYROXINE SODIUM 50 MCG PO TABS: 50.0000 ug | ORAL_TABLET | Freq: Every day | ORAL | Status: DC

## 2015-09-24 NOTE — Telephone Encounter (Signed)
Rx refill send to [facility]

## 2015-09-30 ENCOUNTER — Ambulatory Visit: Payer: Self-pay | Attending: Family Medicine | Admitting: Family Medicine

## 2015-09-30 ENCOUNTER — Encounter: Payer: Self-pay | Admitting: Family Medicine

## 2015-09-30 VITALS — BP 120/77 | HR 89 | Temp 97.6°F | Resp 16 | Ht 65.0 in | Wt 198.0 lb

## 2015-09-30 DIAGNOSIS — Z87891 Personal history of nicotine dependence: Secondary | ICD-10-CM | POA: Insufficient documentation

## 2015-09-30 DIAGNOSIS — M542 Cervicalgia: Secondary | ICD-10-CM | POA: Insufficient documentation

## 2015-09-30 DIAGNOSIS — Z7982 Long term (current) use of aspirin: Secondary | ICD-10-CM | POA: Insufficient documentation

## 2015-09-30 DIAGNOSIS — E039 Hypothyroidism, unspecified: Secondary | ICD-10-CM | POA: Insufficient documentation

## 2015-09-30 DIAGNOSIS — Z79899 Other long term (current) drug therapy: Secondary | ICD-10-CM | POA: Insufficient documentation

## 2015-09-30 DIAGNOSIS — J0100 Acute maxillary sinusitis, unspecified: Secondary | ICD-10-CM | POA: Insufficient documentation

## 2015-09-30 MED ORDER — AMOXICILLIN 500 MG PO CAPS: 500.0000 mg | ORAL_CAPSULE | Freq: Three times a day (TID) | ORAL | Status: DC

## 2015-09-30 NOTE — Progress Notes (Signed)
Subjective:  Patient ID: Stacy [name], female    DOB: Oct 08, 1955  Age: 60 y.o. MRN: [id number]  CC: Hypothyroidism   HPI Jalaiyah Throgmorton Kunsman presents for   1.  Hypothyroidism: was out of synthroid for 2 weeks. Has restarted it about one week ago. 5 lb weight gain since last OV. Mild dizziness, has hx of vertigo. No CP, palpitations or swelling.   2. Neck pain: x one month, R sided. With R ear pain. Also with R sided face pain. No fever or chills.    Social History  Substance Use Topics  . Smoking status: Former Smoker    Quit date: 09/18/1999  . Smokeless tobacco: Never Used  . Alcohol Use: 0.0 oz/week    0 Standard drinks or equivalent per week     Comment: Ocasional    Outpatient Prescriptions Prior to Visit  Medication Sig Dispense Refill  . Armodafinil (NUVIGIL) 200 MG TABS Take 1 tablet by mouth daily. (Patient not taking: Reported on 02/04/2015) 30 tablet 1  . aspirin EC 81 MG tablet Take 81 mg by mouth daily.     . clonazePAM (KLONOPIN) 0.5 MG tablet Take 0.5 mg by mouth 2 (two) times daily as needed for anxiety.    Marland Kitchen erythromycin [facility]) ophthalmic ointment Place 1 application into the right eye 4 (four) times daily. 1 g 1  . FLUoxetine (PROZAC) 40 MG capsule Take 40 mg by mouth daily.    Marland Kitchen HYDROcodone-acetaminophen (NORCO/VICODIN) 5-325 MG per tablet Take 1 tablet by mouth every 6 (six) hours as needed for moderate pain.    Marland Kitchen levothyroxine (SYNTHROID, LEVOTHROID) 50 MCG tablet Take 1 tablet (50 mcg total) by mouth daily before breakfast. 30 tablet 3  . Multiple Vitamin (MULTIVITAMIN WITH MINERALS) TABS Take 1 tablet by mouth daily.    . Omega-3 Fatty Acids (FISH OIL) 1000 MG CAPS Take by mouth.     No facility-administered medications prior to visit.    ROS Review of Systems  Constitutional: Negative for fever and chills.  HENT: Positive for ear pain.   Eyes: Negative for visual disturbance.  Respiratory: Negative for shortness of breath.   Cardiovascular:  Negative for chest pain.  Gastrointestinal: Negative for abdominal pain and blood in stool.  Musculoskeletal: Positive for neck pain. Negative for back pain and arthralgias.  Skin: Negative for rash.  Allergic/Immunologic: Negative for immunocompromised state.  Neurological: Positive for dizziness.  Hematological: Negative for adenopathy. Does not bruise/bleed easily.  Psychiatric/Behavioral: Negative for suicidal ideas and dysphoric mood.    Objective:  BP 120/77 mmHg  Pulse 89  Temp(Src) 97.6 F (36.4 C) (Oral)  Resp 16  Ht '5\' 5"'$  (1.651 m)  Wt 198 lb (89.812 kg)  BMI 32.95 kg/m2  SpO2 97%  BP/Weight 09/30/2015 03/18/2015 03/19/1600  Systolic BP 093 235 573  Diastolic BP 77 84 72  Wt. (Lbs) 198 193 192  BMI 32.95 33.11 32.94    Physical Exam  Constitutional: She is oriented to person, place, and time. She appears well-developed and well-nourished. No distress.  HENT:  Head: Normocephalic and atraumatic.  Right Ear: Tympanic membrane and external ear normal. Tympanic membrane is not injected, not scarred, not perforated, not erythematous, not retracted and not bulging.  Left Ear: Tympanic membrane and external ear normal. Tympanic membrane is not injected, not scarred, not perforated, not erythematous, not retracted and not bulging.  + air fluid levels in R >L TM  Cardiovascular: Normal rate, regular rhythm, normal heart sounds and intact distal  pulses.   Pulmonary/Chest: Effort normal and breath sounds normal.  Musculoskeletal: She exhibits no edema.  Neurological: She is alert and oriented to person, place, and time.  Skin: Skin is warm and dry. No rash noted.  Psychiatric: She has a normal mood and affect.   Assessment & Plan:   Problem List Items Addressed This Visit    None    Visit Diagnoses    Acute maxillary sinusitis, recurrence not specified    -  Primary    Relevant Medications    amoxicillin (AMOXIL) 500 MG capsule       No orders of the defined types  were placed in this encounter.    Follow-up: No Follow-up on file.   Boykin Nearing MD

## 2015-09-30 NOTE — Patient Instructions (Signed)
Stacy [name] was seen today for hypothyroidism, dizziness and neck pain.  Diagnoses and all orders for this visit:  Acute maxillary sinusitis, recurrence not specified -     Discontinue: amoxicillin (AMOXIL) 500 MG capsule; Take 1 capsule (500 mg total) by mouth 3 (three) times daily. -     amoxicillin (AMOXIL) 500 MG capsule; Take 1 capsule (500 mg total) by mouth 3 (three) times daily.   F/u in 4 weeks for wellness physical  Dr. Adrian Blackwater   Sinusitis, Adult Sinusitis is redness, soreness, and inflammation of the paranasal sinuses. Paranasal sinuses are air pockets within the bones of your face. They are located beneath your eyes, in the middle of your forehead, and above your eyes. In healthy paranasal sinuses, mucus is able to drain out, and air is able to circulate through them by way of your nose. However, when your paranasal sinuses are inflamed, mucus and air can become trapped. This can allow bacteria and other germs to grow and cause infection. Sinusitis can develop quickly and last only a short time (acute) or continue over a long period (chronic). Sinusitis that lasts for more than 12 weeks is considered chronic. CAUSES Causes of sinusitis include:  Allergies.  Structural abnormalities, such as displacement of the cartilage that separates your nostrils (deviated septum), which can decrease the air flow through your nose and sinuses and affect sinus drainage.  Functional abnormalities, such as when the small hairs (cilia) that line your sinuses and help remove mucus do not work properly or are not present. SIGNS AND SYMPTOMS Symptoms of acute and chronic sinusitis are the same. The primary symptoms are pain and pressure around the affected sinuses. Other symptoms include:  Upper toothache.  Earache.  Headache.  Bad breath.  Decreased sense of smell and taste.  A cough, which worsens when you are lying flat.  Fatigue.  Fever.  Thick drainage from your nose, which often  is green and may contain pus (purulent).  Swelling and warmth over the affected sinuses. DIAGNOSIS Your health care provider will perform a physical exam. During your exam, your health care provider may perform any of the following to help determine if you have acute sinusitis or chronic sinusitis:  Look in your nose for signs of abnormal growths in your nostrils (nasal polyps).  Tap over the affected sinus to check for signs of infection.  View the inside of your sinuses using an imaging device that has a light attached (endoscope). If your health care provider suspects that you have chronic sinusitis, one or more of the following tests may be recommended:  Allergy tests.  Nasal culture. A sample of mucus is taken from your nose, sent to a lab, and screened for bacteria.  Nasal cytology. A sample of mucus is taken from your nose and examined by your health care provider to determine if your sinusitis is related to an allergy. TREATMENT Most cases of acute sinusitis are related to a viral infection and will resolve on their own within 10 days. Sometimes, medicines are prescribed to help relieve symptoms of both acute and chronic sinusitis. These may include pain medicines, decongestants, nasal steroid sprays, or saline sprays. However, for sinusitis related to a bacterial infection, your health care provider will prescribe antibiotic medicines. These are medicines that will help kill the bacteria causing the infection. Rarely, sinusitis is caused by a fungal infection. In these cases, your health care provider will prescribe antifungal medicine. For some cases of chronic sinusitis, surgery is needed. Generally, these  are cases in which sinusitis recurs more than 3 times per year, despite other treatments. HOME CARE INSTRUCTIONS  Drink plenty of water. Water helps thin the mucus so your sinuses can drain more easily.  Use a humidifier.  Inhale steam 3-4 times a day (for example, sit in  the bathroom with the shower running).  Apply a warm, moist washcloth to your face 3-4 times a day, or as directed by your health care provider.  Use saline nasal sprays to help moisten and clean your sinuses.  Take medicines only as directed by your health care provider.  If you were prescribed either an antibiotic or antifungal medicine, finish it all even if you start to feel better. SEEK IMMEDIATE MEDICAL CARE IF:  You have increasing pain or severe headaches.  You have nausea, vomiting, or drowsiness.  You have swelling around your face.  You have vision problems.  You have a stiff neck.  You have difficulty breathing.   This information is not intended to replace advice given to you by your health care provider. Make sure you discuss any questions you have with your health care provider.   Document Released: 09/06/2005 Document Revised: 09/27/2014 Document Reviewed: 09/21/2011 Elsevier Interactive Patient Education [employer].

## 2015-09-30 NOTE — Progress Notes (Signed)
C/C pain on rt ear x 1 month and neck pain no Hx injury  Hx vertigo  Pain scale 8 No tobacco user  No suicidal thought in the past two weeks

## 2015-10-28 ENCOUNTER — Encounter: Payer: Self-pay | Admitting: Family Medicine

## 2015-10-28 ENCOUNTER — Ambulatory Visit: Payer: Self-pay | Attending: Family Medicine | Admitting: Family Medicine

## 2015-10-28 ENCOUNTER — Telehealth: Payer: Self-pay | Admitting: Family Medicine

## 2015-10-28 VITALS — BP 114/76 | HR 85 | Temp 98.6°F | Resp 16 | Ht 65.0 in | Wt 198.0 lb

## 2015-10-28 DIAGNOSIS — Z7982 Long term (current) use of aspirin: Secondary | ICD-10-CM | POA: Insufficient documentation

## 2015-10-28 DIAGNOSIS — E039 Hypothyroidism, unspecified: Secondary | ICD-10-CM

## 2015-10-28 DIAGNOSIS — M545 Low back pain, unspecified: Secondary | ICD-10-CM

## 2015-10-28 DIAGNOSIS — Z85118 Personal history of other malignant neoplasm of bronchus and lung: Secondary | ICD-10-CM

## 2015-10-28 DIAGNOSIS — H8113 Benign paroxysmal vertigo, bilateral: Secondary | ICD-10-CM

## 2015-10-28 DIAGNOSIS — Z79899 Other long term (current) drug therapy: Secondary | ICD-10-CM | POA: Insufficient documentation

## 2015-10-28 DIAGNOSIS — Z1159 Encounter for screening for other viral diseases: Secondary | ICD-10-CM

## 2015-10-28 DIAGNOSIS — C3491 Malignant neoplasm of unspecified part of right bronchus or lung: Secondary | ICD-10-CM

## 2015-10-28 DIAGNOSIS — H8110 Benign paroxysmal vertigo, unspecified ear: Secondary | ICD-10-CM | POA: Insufficient documentation

## 2015-10-28 DIAGNOSIS — Z0000 Encounter for general adult medical examination without abnormal findings: Secondary | ICD-10-CM

## 2015-10-28 DIAGNOSIS — Z1239 Encounter for other screening for malignant neoplasm of breast: Secondary | ICD-10-CM

## 2015-10-28 DIAGNOSIS — G8929 Other chronic pain: Secondary | ICD-10-CM | POA: Insufficient documentation

## 2015-10-28 DIAGNOSIS — Z114 Encounter for screening for human immunodeficiency virus [HIV]: Secondary | ICD-10-CM

## 2015-10-28 DIAGNOSIS — Z87891 Personal history of nicotine dependence: Secondary | ICD-10-CM | POA: Insufficient documentation

## 2015-10-28 DIAGNOSIS — K5190 Ulcerative colitis, unspecified, without complications: Secondary | ICD-10-CM | POA: Insufficient documentation

## 2015-10-28 LAB — CBC
HCT: 38.9 % (ref 36.0–46.0)
Hemoglobin: 12.7 g/dL (ref 12.0–15.0)
MCH: 28.9 pg (ref 26.0–34.0)
MCHC: 32.6 g/dL (ref 30.0–36.0)
MCV: 88.6 fL (ref 78.0–100.0)
MPV: 8.4 fL — ABNORMAL LOW (ref 8.6–12.4)
Platelets: 368 10*3/uL (ref 150–400)
RBC: 4.39 MIL/uL (ref 3.87–5.11)
RDW: 13.5 % (ref 11.5–15.5)
WBC: 7.1 10*3/uL (ref 4.0–10.5)

## 2015-10-28 LAB — COMPLETE METABOLIC PANEL WITH GFR
ALBUMIN: 4.4 g/dL (ref 3.6–5.1)
ALT: 15 U/L (ref 6–29)
AST: 18 U/L (ref 10–35)
Alkaline Phosphatase: 51 U/L (ref 33–130)
BUN: 12 mg/dL (ref 7–25)
CO2: 25 mmol/L (ref 20–31)
CREATININE: 0.62 mg/dL (ref 0.50–1.05)
Calcium: 9.1 mg/dL (ref 8.6–10.4)
Chloride: 102 mmol/L (ref 98–110)
GFR, Est African American: >89 mL/min (ref >=60)
GLUCOSE: 82 mg/dL (ref 65–99)
Potassium: 4.9 mmol/L (ref 3.5–5.3)
SODIUM: 139 mmol/L (ref 135–146)
TOTAL PROTEIN: 7.7 g/dL (ref 6.1–8.1)
Total Bilirubin: 0.4 mg/dL (ref 0.2–1.2)

## 2015-10-28 LAB — LIPID PANEL
Cholesterol: 198 mg/dL (ref 125–200)
HDL: 58 mg/dL (ref >=46)
LDL Cholesterol: 124 mg/dL (ref <130)
Total CHOL/HDL Ratio: 3.4 Ratio (ref <=5.0)
Triglycerides: 82 mg/dL (ref <150)
VLDL: 16 mg/dL (ref <30)

## 2015-10-28 LAB — POCT GLYCOSYLATED HEMOGLOBIN (HGB A1C): HEMOGLOBIN A1C: 5.5

## 2015-10-28 LAB — TSH: TSH: 1.74 mIU/L

## 2015-10-28 MED ORDER — MECLIZINE HCL 25 MG PO TABS: 25.0000 mg | ORAL_TABLET | Freq: Three times a day (TID) | ORAL | Status: DC | PRN

## 2015-10-28 MED ORDER — TRAMADOL HCL 50 MG PO TABS: 50.0000 mg | ORAL_TABLET | Freq: Three times a day (TID) | ORAL | Status: DC | PRN

## 2015-10-28 MED ORDER — MECLIZINE HCL 32 MG PO TABS: 32.0000 mg | ORAL_TABLET | Freq: Three times a day (TID) | ORAL | Status: DC | PRN

## 2015-10-28 NOTE — Assessment & Plan Note (Signed)
History of lung care.  Treated by Dr. Beryle Beams.  Care transferred to Dr. Julien Nordmann. Patient did not go to f/u due to lack of insurance. Last CT chest in 2012, no evidence of recurrent disease  Oncology referral placed

## 2015-10-28 NOTE — Telephone Encounter (Signed)
Pt. Called requesting to speak to PCP on her Vertigo. Pt. Stated she did not receive medication for that. Please f/u

## 2015-10-28 NOTE — Progress Notes (Signed)
Annual physical  Back pain. Pain scale #7 Oncology referral  No tobacco user  No suicidal thoughts in the past two weeks

## 2015-10-28 NOTE — Assessment & Plan Note (Signed)
A; history of lumbar vertebrae fracture P: Tramadol for pain control Dg lumbar spine to evaluate alignment

## 2015-10-28 NOTE — Assessment & Plan Note (Signed)
Recurrent vertigo  Meclizine refilled Vestibular rehab handout provided

## 2015-10-28 NOTE — Patient Instructions (Addendum)
Stacy [name] was seen today for annual exam.  Diagnoses and all orders for this visit:  Acquired hypothyroidism -     TSH  Healthcare maintenance -     HgB A1c -     Ambulatory referral to Gastroenterology -     Flu Vaccine QUAD 36+ mos IM -     Lipid Panel -     COMPLETE METABOLIC PANEL WITH GFR  Breast cancer screening -     MM DIGITAL SCREENING BILATERAL; Future  Screening for HIV (human immunodeficiency virus) -     HIV antibody (with reflex)  Need for hepatitis C screening test -     Hepatitis C antibody, reflex  Chronic low back pain -     traMADol (ULTRAM) 50 MG tablet; Take 1 tablet (50 mg total) by mouth every 8 (eight) hours as needed.  Benign paroxysmal positional vertigo, bilateral -     Discontinue: meclizine (ANTIVERT) 32 MG tablet; Take 1 tablet (32 mg total) by mouth 3 (three) times daily as needed. -     Vitamin D, 25-hydroxy -     CBC -     meclizine (ANTIVERT) 25 MG tablet; Take 1 tablet (25 mg total) by mouth 3 (three) times daily as needed for dizziness.    F/u with itching skin tag removal  F/u for vertigo   In 4 weeks  If following up for both problems needs 30 minute visit   Pap due in 03/2016.   Dr. Adrian Blackwater

## 2015-10-28 NOTE — Progress Notes (Signed)
SUBJECTIVE:  60 y.o. female for annual routine checkup. She has hx of lung cancer, hypothyroidism and ulcerative colitis.   Her last pap was normal in 2014. She is due for repeat pap in 2017.  She is due for mammogram. She endorses dizziness when changing from sitting or laying down concerning for recurrent vertigo.  She endorses low back pain this is chronic since 2003. She endorses anxiety she is followed at [facility]. She takes prozac and klonopin prn. She has tried doxepin with oversedation.    Social History  Substance Use Topics  . Smoking status: Former Smoker    Quit date: 09/18/1999  . Smokeless tobacco: Never Used  . Alcohol Use: 0.0 oz/week    0 Standard drinks or equivalent per week     Comment: Ocasional    Current Outpatient Prescriptions  Medication Sig Dispense Refill  . amoxicillin (AMOXIL) 500 MG capsule Take 1 capsule (500 mg total) by mouth 3 (three) times daily. 30 capsule 0  . aspirin EC 81 MG tablet Take 81 mg by mouth daily.     . clonazePAM (KLONOPIN) 0.5 MG tablet Take 0.5 mg by mouth 2 (two) times daily as needed for anxiety.    Marland Kitchen FLUoxetine (PROZAC) 40 MG capsule Take 40 mg by mouth daily.    Marland Kitchen levothyroxine (SYNTHROID, LEVOTHROID) 50 MCG tablet Take 1 tablet (50 mcg total) by mouth daily before breakfast. 30 tablet 3  . Multiple Vitamin (MULTIVITAMIN WITH MINERALS) TABS Take 1 tablet by mouth daily.    . Omega-3 Fatty Acids (FISH OIL) 1000 MG CAPS Take by mouth.     No current facility-administered medications for this visit.   Allergies: Review of patient's allergies indicates no known allergies.  No LMP recorded. Patient is postmenopausal.  ROS:  Feeling well. No dyspnea or chest pain on exertion.  No abdominal pain, change in bowel habits, black or bloody stools.  No urinary tract symptoms. GYN ROS: normal menses, no abnormal bleeding, pelvic pain or discharge, no breast pain or new or enlarging lumps on self exam. Neuro dizziness. MSK neck pain and low  back pain.   OBJECTIVE:  The patient appears well, alert, oriented x 3, in no distress. BP 114/76 mmHg  Pulse 85  Temp(Src) 98.6 F (37 C) (Oral)  Resp 16  Ht '5\' 5"'$  (1.651 m)  Wt 198 lb (89.812 kg)  BMI 32.95 kg/m2  SpO2 96% ENT normal. TMs normal. Tonsils enlarged with crypts. No swelling or inflammation.  Neck supple. No adenopathy or thyromegaly. PERLA. Lungs are clear, good air entry, no wheezes, rhonchi or rales. S1 and S2 normal, no murmurs, regular rate and rhythm. Abdomen  Healed surgical scar, soft without tenderness, guarding, mass or organomegaly. Extremities show no edema, normal peripheral pulses. Neurological is normal, no focal findings.   Back Exam: Back: Normal Curvature, no deformities or CVA tenderness  Paraspinal Tenderness: absent   LE Strength 5/5  LE Sensation: in tact  LE Reflexes 2+ and symmetric  Straight leg raise: negative   BREAST EXAM: breasts appear normal except L breast significantly larger than R. No suspicious masses, no skin or nipple changes or axillary nodes  PELVIC EXAM: examination not indicated  ASSESSMENT:  well woman  PLAN:  Mammogram  Stacy [name] was seen today for annual exam.  Diagnoses and all orders for this visit:  Acquired hypothyroidism -     TSH  Healthcare maintenance -     HgB A1c -     Ambulatory referral to Gastroenterology -  Flu Vaccine QUAD 36+ mos IM -     Lipid Panel -     COMPLETE METABOLIC PANEL WITH GFR  Breast cancer screening -     MM DIGITAL SCREENING BILATERAL; Future  Screening for HIV (human immunodeficiency virus) -     HIV antibody (with reflex)  Need for hepatitis C screening test -     Hepatitis C antibody, reflex  Chronic low back pain -     traMADol (ULTRAM) 50 MG tablet; Take 1 tablet (50 mg total) by mouth every 8 (eight) hours as needed.  Benign paroxysmal positional vertigo, bilateral -     Discontinue: meclizine (ANTIVERT) 32 MG tablet; Take 1 tablet (32 mg total) by mouth 3  (three) times daily as needed. -     Vitamin D, 25-hydroxy -     CBC -     meclizine (ANTIVERT) 25 MG tablet; Take 1 tablet (25 mg total) by mouth 3 (three) times daily as needed for dizziness.  History of lung cancer -     Ambulatory referral to Oncology  Small cell lung cancer, right [facility]) -     Ambulatory referral to Oncology

## 2015-10-29 LAB — HIV ANTIBODY (ROUTINE TESTING W REFLEX): HIV: NONREACTIVE

## 2015-10-29 LAB — HEPATITIS C ANTIBODY: HCV AB: NEGATIVE

## 2015-10-29 LAB — VITAMIN D 25 HYDROXY (VIT D DEFICIENCY, FRACTURES): VIT D 25 HYDROXY: 45 ng/mL (ref 30–100)

## 2015-10-29 MED ORDER — LEVOTHYROXINE SODIUM 50 MCG PO TABS: 50.0000 ug | ORAL_TABLET | Freq: Every day | ORAL | Status: DC

## 2015-10-29 NOTE — Addendum Note (Signed)
Addended by: Boykin Nearing on: 10/29/2015 02:18 PM   Modules accepted: Orders, SmartSet

## 2015-10-30 NOTE — Telephone Encounter (Signed)
The medicine is the meclizine Ordered on 10/28/15 Please inform patient

## 2015-11-03 ENCOUNTER — Telehealth: Payer: Self-pay | Admitting: Genetic Counselor

## 2015-11-03 NOTE — Telephone Encounter (Signed)
LVM to return call   Please let pt know: Meclizine is for Vertigo  Was order on 11/17/2015 Was send to [facility]

## 2015-11-03 NOTE — Telephone Encounter (Signed)
Lt mess regarding genetic counseling referral °

## 2015-11-04 ENCOUNTER — Other Ambulatory Visit: Payer: Self-pay | Admitting: Medical Oncology

## 2015-11-04 ENCOUNTER — Encounter: Payer: Self-pay | Admitting: *Deleted

## 2015-11-04 ENCOUNTER — Telehealth: Payer: Self-pay | Admitting: Genetic Counselor

## 2015-11-04 ENCOUNTER — Telehealth: Payer: Self-pay | Admitting: Family Medicine

## 2015-11-04 DIAGNOSIS — C3491 Malignant neoplasm of unspecified part of right bronchus or lung: Secondary | ICD-10-CM

## 2015-11-04 NOTE — Progress Notes (Signed)
Oncology Nurse Navigator Documentation  Oncology Nurse Navigator Flowsheets 11/04/2015  Treatment Phase Other/I received referral on Stacy [name] today.  I updated Dr. Julien Nordmann and he stated patient may see survivorship.  I updated Tiffany in HIM  Interventions Referrals  Referrals Survivorship  Acuity Level 1  Time Spent with Patient 15

## 2015-11-04 NOTE — Telephone Encounter (Signed)
Patient is very concern because she receive a call from Genetic Counseling and she wants to know why . Please, give her a call thank you

## 2015-11-04 NOTE — Telephone Encounter (Signed)
Returned pt's call regarding genetic counseling; lt mess

## 2015-11-05 NOTE — Telephone Encounter (Signed)
LVM to return call.

## 2015-11-06 ENCOUNTER — Other Ambulatory Visit: Payer: Self-pay | Admitting: Family Medicine

## 2015-11-06 DIAGNOSIS — Z1231 Encounter for screening mammogram for malignant neoplasm of breast: Secondary | ICD-10-CM

## 2015-11-08 ENCOUNTER — Telehealth: Payer: Self-pay | Admitting: Internal Medicine

## 2015-11-08 NOTE — Telephone Encounter (Signed)
Per staff message response from [employer]izzard re who patient should be seen by - per Hinton Dyer patient should be seen in [facility] per Dr. Julien Nordmann. Email (EPIC) forwarded to [employer]. Elzie Rings will contact patient. pof for 2/14 removed from scheduling inbox.

## 2015-11-11 ENCOUNTER — Telehealth: Payer: Self-pay | Admitting: Adult Health

## 2015-11-11 NOTE — Telephone Encounter (Signed)
Pt. Returned call. Please f/u °

## 2015-11-11 NOTE — Telephone Encounter (Signed)
I attempted to reach Ms. Stacy [name] to discuss the survivorship program with her and her eligibility to see me in the survivorship clinic, as referred by Dr. Julien Nordmann.  I asked that she return my call at her convenience so that we may get this appt scheduled and also schedule a CT of the chest.  I look forward to speaking with her and participating in her care.    [name], NP [place] [phone]

## 2015-11-13 ENCOUNTER — Telehealth: Payer: Self-pay | Admitting: Adult Health

## 2015-11-13 ENCOUNTER — Other Ambulatory Visit: Payer: Self-pay | Admitting: Adult Health

## 2015-11-13 DIAGNOSIS — C3491 Malignant neoplasm of unspecified part of right bronchus or lung: Secondary | ICD-10-CM

## 2015-11-13 DIAGNOSIS — Z85118 Personal history of other malignant neoplasm of bronchus and lung: Secondary | ICD-10-CM

## 2015-11-13 NOTE — Telephone Encounter (Signed)
Stacy [name] returned my call regarding scheduling her survivorship visit.  I briefly explained to her Stacy purpose and role of survivorship in her care as a long-term cancer survivor. She tells me that she does not have insurance currently, but has "an orange wellness card" and is concerned about finances.  I let her know that I would help her connect with one of our financial advocates after her appt with me, if she was interested. She agreed.     Brief History: Stacy [name] was a previous [name] of Stacy [name]; last visit with him was in 11/2013.  She was referred to Stacy [name] after Stacy [name] left Stacy practice.  [name] was offered to schedule an appt with Stacy [name] on several occasions, but unfortunately was lost to follow-up.  Stacy [name] reached out to Stacy [name], Stacy [name] on 11/04/15 and Stacy [name] elected to transfer Stacy [name] to survivorship for long-term surveillance and management.     I have scheduled Stacy [name] to see me on Tuesday, 11/18/15 at 10 am.  I have also placed orders for her to get a chest x-ray at her convenience, preferably prior to her appt with me so we can review Stacy results together.  Chest x-ray was ordered for lung cancer surveillance, in lieu of standard CT chest, given [name]'s lack of insurance and financial concerns.  I gave Stacy [name] instructions on where to go for her x-ray, as well as where to check-in for her appt with me next week.  She expressed verbal understanding and appreciation. I encouraged her to call me with any concerns before her appt here.  I look forward to participating in her care.    [name], NP Survivorship Program [facility] [phone]   Imaging History:  -Last CT chest (07/04/11): IMPRESSION: Right paramediastinal radiation changes.  No evidence of recurrent/metastatic disease in Stacy chest.  -Last Chest X-ray [2 view] (10/03/12): IMPRESSION: Stable right perihilar opacity related to  prior radiation changes. Better demonstrated by prior CT. No superimposed acute process.   -Last Chest X-ray [1 view for dizziness, cough, chest congestion] (09/20/14): IMPRESSION: No acute cardiopulmonary disease. 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

## 2015-11-18 ENCOUNTER — Ambulatory Visit (HOSPITAL_BASED_OUTPATIENT_CLINIC_OR_DEPARTMENT_OTHER): Payer: Self-pay | Admitting: Adult Health

## 2015-11-18 ENCOUNTER — Encounter: Payer: Self-pay | Admitting: Adult Health

## 2015-11-18 ENCOUNTER — Ambulatory Visit (HOSPITAL_COMMUNITY)
Admission: RE | Admit: 2015-11-18 | Discharge: 2015-11-18 | Disposition: A | Discharge status: Home / Self Care | Payer: No Typology Code available for payment source | Source: Ambulatory Visit | Attending: Adult Health | Admitting: Adult Health

## 2015-11-18 VITALS — BP 112/74 | HR 86 | Temp 97.6°F | Resp 16 | Wt 197.2 lb

## 2015-11-18 DIAGNOSIS — Z85118 Personal history of other malignant neoplasm of bronchus and lung: Secondary | ICD-10-CM

## 2015-11-18 DIAGNOSIS — C3491 Malignant neoplasm of unspecified part of right bronchus or lung: Secondary | ICD-10-CM
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
## 2015-11-18 NOTE — Patient Instructions (Signed)
-  Consider calling [employer] to enroll in their fitness program at the [facility].  -Consider getting counseling support for free here at the cancer center.  -I will be in touch with our BCCCP (Breast & Cervical Cancer Control Program) that will offer you free breast and cervical cancer screenings.   I will plan to see you in 1 year.  I'll make that appointment and get it to you in the mail.  We will either do a CT scan of your chest or a repeat chest x-ray, depending on your insurance status.    Please call me if you have other concerns that you think I can help you with!   Thank you for coming in today!  [name], NP [phone]

## 2015-11-21 ENCOUNTER — Telehealth (HOSPITAL_COMMUNITY): Payer: Self-pay | Admitting: *Deleted

## 2015-11-21 NOTE — Telephone Encounter (Signed)
Received referral to schedule patient with the BCCCP program due to needing a mammogram and Pap smear. Attempted to call patient to verify eligibility and schedule appointment. No one answered phone. Left voicemail for patient to call me back.

## 2015-11-25 ENCOUNTER — Ambulatory Visit: Payer: Medicaid Other

## 2015-12-04 ENCOUNTER — Encounter: Payer: Self-pay | Admitting: Adult Health

## 2015-12-04 ENCOUNTER — Other Ambulatory Visit: Payer: Self-pay | Admitting: Adult Health

## 2015-12-04 DIAGNOSIS — C3491 Malignant neoplasm of unspecified part of right bronchus or lung: Secondary | ICD-10-CM

## 2015-12-04 NOTE — Progress Notes (Unsigned)
1 year follow-up appt made for patient to return to survivorship clinic.  I have also placed orders for patient to obtain a routine chest x-ray prior to her visit in 10/2016 for annual surveillance.   I have mailed a letter to the patient's home making her aware of her f/u appt, as well as when to obtain the chest x-ray.  She knows to call me with any questions or concerns before her next visit here.   [name], NP [place] [phone]

## 2015-12-16 ENCOUNTER — Ambulatory Visit: Payer: Medicaid Other

## 2015-12-17 ENCOUNTER — Emergency Department (INDEPENDENT_AMBULATORY_CARE_PROVIDER_SITE_OTHER)
Admission: EM | Admit: 2015-12-17 | Discharge: 2015-12-17 | Disposition: A | Discharge status: Home / Self Care | Payer: No Typology Code available for payment source | Source: Home / Self Care | Attending: Family Medicine | Admitting: Family Medicine

## 2015-12-17 ENCOUNTER — Encounter (HOSPITAL_COMMUNITY): Payer: Self-pay | Admitting: Emergency Medicine

## 2015-12-17 DIAGNOSIS — H00014 Hordeolum externum left upper eyelid: Secondary | ICD-10-CM

## 2015-12-17 NOTE — Discharge Instructions (Signed)
Stye Warm compresses frequently. Continue using the erythromycin ophthalmic ointment 3-4 times a day. See on page one of the on-call ophthalmologist and if not getting better or if you are getting worse call the number to obtain an appointment as soon as possible. A stye is a bump on your eyelid caused by a bacterial infection. A stye can form inside the eyelid (internal stye) or outside the eyelid (external stye). An internal stye may be caused by an infected oil-producing gland inside your eyelid. An external stye may be caused by an infection at the base of your eyelash (hair follicle). Styes are very common. Anyone can get them at any age. They usually occur in just one eye, but you may have more than one in either eye.  CAUSES  The infection is almost always caused by bacteria called Staphylococcus aureus. This is a common type of bacteria that lives on your skin. RISK FACTORS You may be at higher risk for a stye if you have had one before. You may also be at higher risk if you have:  Diabetes.  Long-term illness.  Long-term eye redness.  A skin condition called seborrhea.  High fat levels in your blood (lipids). SIGNS AND SYMPTOMS  Eyelid pain is the most common symptom of a stye. Internal styes are more painful than external styes. Other signs and symptoms may include:  Painful swelling of your eyelid.  A scratchy feeling in your eye.  Tearing and redness of your eye.  Pus draining from the stye. DIAGNOSIS  Your health care provider may be able to diagnose a stye just by examining your eye. The health care provider may also check to make sure:  You do not have a fever or other signs of a more serious infection.  The infection has not spread to other parts of your eye or areas around your eye. TREATMENT  Most styes will clear up in a few days without treatment. In some cases, you may need to use antibiotic drops or ointment to prevent infection. Your health care provider  may have to drain the stye surgically if your stye is:  Large.  Causing a lot of pain.  Interfering with your vision. This can be done using a thin blade or a needle.  HOME CARE INSTRUCTIONS   Take medicines only as directed by your health care provider.  Apply a clean, warm compress to your eye for 10 minutes, 4 times a day.  Do not wear contact lenses or eye makeup until your stye has healed.  Do not try to pop or drain the stye. SEEK MEDICAL CARE IF:  You have chills or a fever.  Your stye does not go away after several days.  Your stye affects your vision.  Your eyeball becomes swollen, red, or painful. MAKE SURE YOU:  Understand these instructions.  Will watch your condition.  Will get help right away if you are not doing well or get worse.   This information is not intended to replace advice given to you by your health care provider. Make sure you discuss any questions you have with your health care provider.   Document Released: 06/16/2005 Document Revised: 09/27/2014 Document Reviewed: 12/21/2013 Elsevier Interactive Patient Education [employer].

## 2015-12-17 NOTE — ED Notes (Addendum)
Patient reports similar episode with right eye last year and treated for blepharitis with erythromycin ointment.  Left eye lids swollen and painful.  Patient reports swelling is moving below eye

## 2015-12-17 NOTE — ED Provider Notes (Signed)
CSN: [id number]     Arrival date & time 12/17/15  1312 History   First MD Initiated Contact with Patient 12/17/15 1439     Chief Complaint  Patient presents with  . Eye Problem   (Consider location/radiation/quality/duration/timing/severity/associated sxs/prior Treatment) HPI Comments: 60-year-old female complaining of left eye pain and swelling for 2-3 days. She noticed a small bump to the outer corner of the upper lid. She started using erythromycin ointment and applying cold compresses but there has been no change. She continues to have minor erythema or pink color and swelling to the upper lid and lesser just below the lower lid. Denies changes in vision. Denies pain to the eye proper but there is soreness to the upper eyelid. No known injury.   Past Medical History  Diagnosis Date  . Small cell lung cancer ([place]) 04/24/2012    Presented 01/1999 w right supraclavicular lymph node enlargement. Rx Carboplatinum/etoposide X 6, Topotecan X 2, Radiation  . 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 Acquired hypothyroidism 04/24/2012  . 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 H/O chronic ulcerative colitis 04/24/2012    Mild   . Depression with anxiety 04/24/2012  . Vertigo   . Anxiety    Past Surgical History  Procedure Laterality Date  . Laproscopy    . Abdominal hysterectomy     Family History  Problem Relation Age of Onset  . Hypertension Mother   . High Cholesterol Mother   . Prostate cancer Father    Social History  Substance Use Topics  . Smoking status: Former Smoker    Quit date: 09/18/1999  . Smokeless tobacco: Never Used  . Alcohol Use: 0.0 oz/week    0 Standard drinks or equivalent per week     Comment: Ocasional    OB History    No data available     Review of Systems  Constitutional: Negative.   HENT: Negative.   Eyes: Positive for pain and redness. Negative for discharge, itching and visual disturbance.  Respiratory:  Negative.   Neurological: Negative.     Allergies  Review of patient's allergies indicates no known allergies.  Home Medications   Prior to Admission medications   Medication Sig Start Date End Date Taking? Authorizing Provider  cetirizine (ZYRTEC) 10 MG tablet Take 10 mg by mouth daily.   Yes Historical Provider, MD  erythromycin ophthalmic ointment 1 application at bedtime.   Yes Historical Provider, MD  Pseudoephedrine-APAP-DM (DAYQUIL PO) Take by mouth.   Yes Historical Provider, MD  aspirin EC 81 MG tablet Take 81 mg by mouth daily.     Historical Provider, MD  clonazePAM (KLONOPIN) 0.5 MG tablet Take 0.5 mg by mouth 2 (two) times daily as needed for anxiety.    Historical Provider, MD  FLUoxetine (PROZAC) 40 MG capsule Take 40 mg by mouth daily.    Historical Provider, MD  ibuprofen (ADVIL,MOTRIN) 200 MG tablet Take 200 mg by mouth 3 (three) times daily.    Historical Provider, MD  levothyroxine (SYNTHROID, LEVOTHROID) 50 MCG tablet Take 1 tablet (50 mcg total) by mouth daily before breakfast. 10/29/15   Boykin Nearing, MD  meclizine (ANTIVERT) 25 MG tablet Take 1 tablet (25 mg total) by mouth 3 (three) times daily as needed for dizziness. 10/28/15   Boykin Nearing, MD  Multiple Vitamin (MULTIVITAMIN WITH MINERALS) TABS Take 1 tablet by mouth daily.    Historical Provider, MD  Omega-3 Fatty Acids (FISH OIL) 1000 MG  CAPS Take by mouth.    Historical Provider, MD  traMADol (ULTRAM) 50 MG tablet Take 1 tablet (50 mg total) by mouth every 8 (eight) hours as needed. 10/28/15   Boykin Nearing, MD   Meds Ordered and Administered this Visit  Medications - No data to display  BP 119/68 mmHg  Pulse 92  Temp(Src) 98.4 F (36.9 C) (Oral)  Resp 14  SpO2 96% No data found.   Physical Exam  Constitutional: She is oriented to person, place, and time. She appears well-developed and well-nourished. No distress.  Eyes: EOM are normal. Pupils are equal, round, and reactive to light.  Left upper  and lower lid with mild swelling, soreness and pink color as opposed to a dark erythema. The edge of the upper lid along the bottom of the eyelashes for the outer canthus contains a very small papule that is not pustular or fluctuant. There is minor erythema surrounding this lesion as well as minor erythema to the conjunctiva of the upper lid. No drainage. No apparent involvement of the sclera. Anterior chamber is clear.  Neck: Normal range of motion. Neck supple.  Cardiovascular: Normal rate.   Pulmonary/Chest: Effort normal. No respiratory distress.  Musculoskeletal: She exhibits no edema.  Neurological: She is alert and oriented to person, place, and time. She exhibits normal muscle tone.  Skin: Skin is warm and dry.  Psychiatric: She has a normal mood and affect.  Nursing note and vitals reviewed.   ED Course  Procedures (including critical care time)  Labs Review Labs Reviewed - No data to display  Imaging Review No results found.   Visual Acuity Review  Right Eye Distance: 20/25 Left Eye Distance: 20/20 Bilateral Distance: 20/25  Right Eye Near:   Left Eye Near:    Bilateral Near:         MDM   1. Hordeolum externum of left upper eyelid    Warm compresses frequently. Continue using the erythromycin ophthalmic ointment 3-4 times a day. See on page one of the on-call ophthalmologist and if not getting better or if you are getting worse call the number to obtain an appointment as soon as possible.    Janne Napoleon, NP 12/17/15 [phone]

## 2016-01-18 ENCOUNTER — Other Ambulatory Visit: Payer: Self-pay | Admitting: Family Medicine

## 2016-01-19 ENCOUNTER — Ambulatory Visit: Payer: Medicaid Other

## 2016-01-23 ENCOUNTER — Other Ambulatory Visit: Payer: Self-pay | Admitting: Family Medicine

## 2016-01-29 ENCOUNTER — Other Ambulatory Visit: Payer: Self-pay | Admitting: Family Medicine

## 2016-02-05 ENCOUNTER — Ambulatory Visit
Admission: RE | Admit: 2016-02-05 | Discharge: 2016-02-05 | Disposition: A | Discharge status: Home / Self Care | Payer: No Typology Code available for payment source | Source: Ambulatory Visit | Attending: Family Medicine | Admitting: Family Medicine

## 2016-02-05 DIAGNOSIS — Z1231 Encounter for screening mammogram for malignant neoplasm of breast: Secondary | ICD-10-CM

## 2016-02-15 ENCOUNTER — Emergency Department (HOSPITAL_COMMUNITY)
Admission: EM | Admit: 2016-02-15 | Discharge: 2016-02-15 | Disposition: A | Discharge status: Home / Self Care | Payer: Self-pay | Attending: Emergency Medicine | Admitting: Emergency Medicine

## 2016-02-15 ENCOUNTER — Emergency Department (HOSPITAL_COMMUNITY): Payer: Self-pay

## 2016-02-15 ENCOUNTER — Encounter (HOSPITAL_COMMUNITY): Payer: Self-pay | Admitting: Emergency Medicine

## 2016-02-15 DIAGNOSIS — M546 Pain in thoracic spine: Secondary | ICD-10-CM | POA: Insufficient documentation

## 2016-02-15 DIAGNOSIS — Z85118 Personal history of other malignant neoplasm of bronchus and lung: Secondary | ICD-10-CM | POA: Insufficient documentation

## 2016-02-15 DIAGNOSIS — Y999 Unspecified external cause status: Secondary | ICD-10-CM | POA: Insufficient documentation

## 2016-02-15 DIAGNOSIS — S39012A Strain of muscle, fascia and tendon of lower back, initial encounter: Secondary | ICD-10-CM | POA: Insufficient documentation

## 2016-02-15 DIAGNOSIS — Y939 Activity, unspecified: Secondary | ICD-10-CM | POA: Insufficient documentation

## 2016-02-15 DIAGNOSIS — W010XXA Fall on same level from slipping, tripping and stumbling without subsequent striking against object, initial encounter: Secondary | ICD-10-CM | POA: Insufficient documentation

## 2016-02-15 DIAGNOSIS — Z87891 Personal history of nicotine dependence: Secondary | ICD-10-CM | POA: Insufficient documentation

## 2016-02-15 DIAGNOSIS — Z7982 Long term (current) use of aspirin: Secondary | ICD-10-CM | POA: Insufficient documentation

## 2016-02-15 DIAGNOSIS — F329 Major depressive disorder, single episode, unspecified: Secondary | ICD-10-CM | POA: Insufficient documentation

## 2016-02-15 DIAGNOSIS — Y92009 Unspecified place in unspecified non-institutional (private) residence as the place of occurrence of the external cause: Secondary | ICD-10-CM | POA: Insufficient documentation

## 2016-02-15 MED ORDER — HYDROMORPHONE HCL 1 MG/ML IJ SOLN
1.0000 mg | Freq: Once | INTRAMUSCULAR | Status: AC
  Administered 2016-02-15: 1 mg via INTRAVENOUS
  Filled 2016-02-15: qty 1

## 2016-02-15 MED ORDER — HYDROCODONE-ACETAMINOPHEN 5-325 MG PO TABS: 1.0000 | ORAL_TABLET | ORAL | Status: DC | PRN

## 2016-02-15 MED ORDER — METHOCARBAMOL 500 MG PO TABS: 500.0000 mg | ORAL_TABLET | Freq: Four times a day (QID) | ORAL | Status: DC

## 2016-02-15 MED ORDER — NAPROXEN 500 MG PO TABS: 500.0000 mg | ORAL_TABLET | Freq: Two times a day (BID) | ORAL | Status: DC

## 2016-02-15 NOTE — ED Notes (Signed)
Discharge instructions, follow up care, and rx x3 reviewed with patient. Patient verbalized understanding. 

## 2016-02-15 NOTE — ED Provider Notes (Signed)
CSN: [id number]     Arrival date & time 02/15/16  1352 History   First MD Initiated Contact with Patient 02/15/16 1358     Chief Complaint  Patient presents with  . Back Pain    HPI  Pt slipped on a wet floor at home.  This occurred at around 10 am.  She landed on her lower back and left arm.  She is having severe pain in her lower back.  She was able to get up initially and walk but as the day has progressed the pain has increased.  She does not feel like she can walk now because of the pain.  She also has some soreness in her hand which she used to break her fall but does not feel like anything is broken. No LOC.  No numbness or weakness.  She tried a heating pad but it did not help. Past Medical History  Diagnosis Date  . Small cell lung cancer ([place]) 04/24/2012    Presented 01/1999 w right supraclavicular lymph node enlargement. Rx Carboplatinum/etoposide X 6, Topotecan X 2, Radiation  . 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 Acquired hypothyroidism 04/24/2012  . 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 H/O chronic ulcerative colitis 04/24/2012    Mild   . Depression with anxiety 04/24/2012  . Vertigo   . Anxiety    Past Surgical History  Procedure Laterality Date  . Laproscopy    . Abdominal hysterectomy     Family History  Problem Relation Age of Onset  . Hypertension Mother   . High Cholesterol Mother   . Prostate cancer Father    Social History  Substance Use Topics  . Smoking status: Former Smoker    Quit date: 09/18/1999  . Smokeless tobacco: Never Used  . Alcohol Use: 0.0 oz/week    0 Standard drinks or equivalent per week     Comment: Ocasional    OB History    No data available     Review of Systems  All other systems reviewed and are negative.     Allergies  Review of patient's allergies indicates no known allergies.  Home Medications   Prior to Admission medications   Medication Sig Start Date End Date  Taking? Authorizing Provider  aspirin EC 81 MG tablet Take 81 mg by mouth daily.     Historical Provider, MD  cetirizine (ZYRTEC) 10 MG tablet Take 10 mg by mouth daily.    Historical Provider, MD  clonazePAM (KLONOPIN) 0.5 MG tablet Take 0.5 mg by mouth 2 (two) times daily as needed for anxiety.    Historical Provider, MD  erythromycin ophthalmic ointment 1 application at bedtime.    Historical Provider, MD  FLUoxetine (PROZAC) 40 MG capsule Take 40 mg by mouth daily.    Historical Provider, MD  HYDROcodone-acetaminophen (NORCO/VICODIN) 5-325 MG tablet Take 1 tablet by mouth every 4 (four) hours as needed. 02/15/16   Dorie Rank, MD  ibuprofen (ADVIL,MOTRIN) 200 MG tablet Take 200 mg by mouth 3 (three) times daily.    Historical Provider, MD  levothyroxine (SYNTHROID, LEVOTHROID) 50 MCG tablet Take 1 tablet (50 mcg total) by mouth daily before breakfast. 10/29/15   Boykin Nearing, MD  meclizine (ANTIVERT) 25 MG tablet Take 1 tablet (25 mg total) by mouth 3 (three) times daily as needed for dizziness. 10/28/15   Josalyn Funches, MD  methocarbamol (ROBAXIN) 500 MG tablet Take 1 tablet (500 mg total) by mouth 4 (  four) times daily. 02/15/16   Dorie Rank, MD  Multiple Vitamin (MULTIVITAMIN WITH MINERALS) TABS Take 1 tablet by mouth daily.    Historical Provider, MD  naproxen (NAPROSYN) 500 MG tablet Take 1 tablet (500 mg total) by mouth 2 (two) times daily. 02/15/16   Dorie Rank, MD  Omega-3 Fatty Acids (FISH OIL) 1000 MG CAPS Take by mouth.    Historical Provider, MD  Pseudoephedrine-APAP-DM (DAYQUIL PO) Take by mouth.    Historical Provider, MD  traMADol (ULTRAM) 50 MG tablet Take 1 tablet (50 mg total) by mouth every 8 (eight) hours as needed. 10/28/15   Josalyn Funches, MD   BP 108/78 mmHg  Pulse 88  Temp(Src) 98.2 F (36.8 C) (Oral)  Resp 18  Ht '5\' 5"'$  (1.651 m)  Wt 89.359 kg  BMI 32.78 kg/m2  SpO2 97% Physical Exam  Constitutional: She appears well-developed and well-nourished. No distress.  HENT:   Head: Normocephalic and atraumatic.  Right Ear: External ear normal.  Left Ear: External ear normal.  Eyes: Conjunctivae are normal. Right eye exhibits no discharge. Left eye exhibits no discharge. No scleral icterus.  Neck: Neck supple. No tracheal deviation present.  Cardiovascular: Normal rate, regular rhythm and intact distal pulses.   Pulmonary/Chest: Effort normal and breath sounds normal. No stridor. No respiratory distress. She has no wheezes. She has no rales.  Abdominal: Soft. Bowel sounds are normal. She exhibits no distension. There is no tenderness. There is no rebound and no guarding.  Musculoskeletal: She exhibits no edema.       Left wrist: She exhibits normal range of motion, no tenderness, no bony tenderness, no swelling and no effusion.       Thoracic back: She exhibits tenderness and bony tenderness. She exhibits no swelling, no edema and no deformity.       Lumbar back: She exhibits tenderness and bony tenderness. She exhibits no swelling, no edema and no deformity.  Neurological: She is alert. She has normal strength. No cranial nerve deficit (no facial droop, extraocular movements intact, no slurred speech) or sensory deficit. She exhibits normal muscle tone. She displays no seizure activity. Coordination normal.  Skin: Skin is warm and dry. No rash noted.  Psychiatric: She has a normal mood and affect.  Nursing note and vitals reviewed.   ED Course  Procedures (including critical care time) Labs Review Labs Reviewed - No data to display  Imaging Review Dg Thoracic Spine 2 View  02/15/2016  CLINICAL DATA:  Thoracic back pain after fall on left side today. EXAM: THORACIC SPINE 2 VIEWS COMPARISON:  11/18/2015 lateral chest radiograph and 07/04/2011 chest CT. FINDINGS: Surgical clips overlie the right supraclavicular neck. There is very mild anterior compression fracture of the T11 vertebral body, which is unchanged compared to the lateral chest radiograph from  11/18/2015. Remaining thoracic vertebral body heights are preserved, with no additional thoracic spine fracture. Mild thoracic spine spondylosis. No suspicious focal osseous lesion. No thoracic spine subluxation. IMPRESSION: Stable chronic mild anterior T11 vertebral compression fracture. No acute fracture or malalignment in the thoracic spine. Mild thoracic spondylosis. Electronically Signed   By: Ilona Sorrel M.D.   On: 02/15/2016 14:55   Dg Lumbar Spine Complete  02/15/2016  CLINICAL DATA:  Low back pain after fall earlier today onto left side. EXAM: LUMBAR SPINE - COMPLETE 4+ VIEW COMPARISON:  07/03/2011 CT abdomen/pelvis. FINDINGS: This report assumes 5 non rib-bearing lumbar vertebrae. Mild anterior T11 vertebral compression fracture is unchanged since lateral chest radiograph from 11/18/2015.  There is a mild to moderate chronic anterior L1 vertebral compression fracture, with approximately 40% anterior loss of vertebral body height, not appreciably changed since 07/03/2011 CT abdomen/pelvis. Remaining lumbar vertebral body heights are preserved, with no additional lumbar spine fracture. Mild degenerative disc disease at T12-L1. Mild loss of disc height at L4-5. Remaining lumbar disc heights are preserved. Mild 4 mm anterolisthesis at L4-5, new since 07/03/2011. Mild bilateral facet arthropathy in the lower lumbar spine. No aggressive appearing focal osseous lesions. IMPRESSION: 1. Stable chronic mild anterior T11 and mild-to-moderate anterior L1 vertebral compression fractures. 2. No acute lumbar spine fracture. 3. Mild degenerative disc disease at T12-L1 and L4-5. 4. Mild 4 mm anterolisthesis at L4-5, new since 07/03/2011. 5. Mild bilateral facet arthropathy in the lower lumbar spine. Electronically Signed   By: Ilona Sorrel M.D.   On: 02/15/2016 15:00   I have personally reviewed and evaluated these images and lab results as part of my medical decision-making.    MDM   Final diagnoses:  Lumbar  strain, initial encounter    Prior compression fractures are noted on the x-ray but no signs of any acute fracture. She has normal strength and sensation. Of any neurovascular injury. Patient was given IV pain medications. In terms improved. Plan on discharge home with medications for pain and muscle relaxants. Consider ice therapy, massage, PT.  Follow up with her doctor if the symptoms are persisting next week   Dorie Rank, MD 02/15/16 1525

## 2016-02-15 NOTE — Discharge Instructions (Signed)

## 2016-02-15 NOTE — ED Notes (Addendum)
Per EMS, patient fell on her back around 10:00 am this morning. Has had increasing back pain throughout the day.  14 years ago patient broke her back and states this pain feels identical to that. Also, complaining of left wrist pain. Denies hitting head and loss of consciousness.

## 2016-02-15 NOTE — ED Notes (Signed)
Patient transported to X-ray 

## 2016-02-15 NOTE — ED Notes (Signed)
Bed: LT53 Expected date: 02/15/16 Expected time: 1:54 PM Means of arrival: Ambulance Comments: Fall, back pain

## 2016-02-18 ENCOUNTER — Telehealth: Payer: Self-pay | Admitting: Family Medicine

## 2016-02-18 ENCOUNTER — Encounter (HOSPITAL_COMMUNITY): Payer: Self-pay | Admitting: *Deleted

## 2016-02-18 ENCOUNTER — Emergency Department (HOSPITAL_COMMUNITY)
Admission: EM | Admit: 2016-02-18 | Discharge: 2016-02-18 | Disposition: A | Discharge status: Home / Self Care | Payer: No Typology Code available for payment source | Attending: Emergency Medicine | Admitting: Emergency Medicine

## 2016-02-18 DIAGNOSIS — E039 Hypothyroidism, unspecified: Secondary | ICD-10-CM | POA: Insufficient documentation

## 2016-02-18 DIAGNOSIS — Z85118 Personal history of other malignant neoplasm of bronchus and lung: Secondary | ICD-10-CM | POA: Insufficient documentation

## 2016-02-18 DIAGNOSIS — Z7982 Long term (current) use of aspirin: Secondary | ICD-10-CM | POA: Insufficient documentation

## 2016-02-18 DIAGNOSIS — Z87891 Personal history of nicotine dependence: Secondary | ICD-10-CM | POA: Insufficient documentation

## 2016-02-18 DIAGNOSIS — M545 Low back pain: Secondary | ICD-10-CM

## 2016-02-18 MED ORDER — DEXAMETHASONE SODIUM PHOSPHATE 10 MG/ML IJ SOLN
10.0000 mg | Freq: Once | INTRAMUSCULAR | Status: AC
  Administered 2016-02-18: 10 mg via INTRAMUSCULAR
  Filled 2016-02-18: qty 1

## 2016-02-18 MED ORDER — HYDROCODONE-ACETAMINOPHEN 5-325 MG PO TABS: 1.0000 | ORAL_TABLET | Freq: Four times a day (QID) | ORAL | Status: DC | PRN

## 2016-02-18 MED ORDER — KETOROLAC TROMETHAMINE 60 MG/2ML IM SOLN
60.0000 mg | Freq: Once | INTRAMUSCULAR | Status: AC
  Administered 2016-02-18: 60 mg via INTRAMUSCULAR
  Filled 2016-02-18: qty 2

## 2016-02-18 MED ORDER — DIAZEPAM 2 MG PO TABS
2.0000 mg | ORAL_TABLET | Freq: Once | ORAL | Status: AC
  Administered 2016-02-18: 2 mg via ORAL
  Filled 2016-02-18: qty 1

## 2016-02-18 NOTE — ED Notes (Signed)
Pt reports she was seen here Sunday d/t fall-found to have back strain.  Pt reports pain in her back is not getting better.  States she was unable to get OOB this am, had to roll on to her stomach and push up to get up.  She reports she is not able to go to work because she sits for 12 hours.  She reports taking robaxin without relief.  Pt is ambulatory without difficulty at this time.  Pt states pain is worse with movement.

## 2016-02-18 NOTE — Telephone Encounter (Signed)
Patient called requesting an appt to follow up from ED visit for back pain.....  Patient was offered an appointment for 02/24/16 @ 4:45, patient declined  Patient stated she would instead go to the ED, she is in a lot of pain and is unable to wait until next week  Patient got upset and stated she could possibly die if she waits until next week

## 2016-02-18 NOTE — Discharge Instructions (Signed)
Read the information below.   Use the prescribed medication as directed.  Please discuss all new medications with your pharmacist.   You are being prescribed pain medication.  The pain medication and muscle relaxer can make you drowsy. Do not take medications at same time. Try to stagger. Also, be sure to take a stool softner as pain medication can make you constipated.  Be sure to apply heat and/or ice as needed for relief. As tolerated try to move around and complete gentle stretching.  Be sure to call and schedule a follow up with your PCP for re-evaluation in one week.  You may return to the Emergency Department at any time for worsening condition or any new symptoms that concern you. Return to ED if you develop fever, numbness or weakness in extremities, loss of bowel or bladder function, numbness in inner thigh.    Back Pain, Adult Back pain is very common in adults.The cause of back pain is rarely dangerous and the pain often gets better over time.The cause of your back pain may not be known. Some common causes of back pain include:  Strain of the muscles or ligaments supporting the spine.  Wear and tear (degeneration) of the spinal disks.  Arthritis.  Direct injury to the back. For many people, back pain may return. Since back pain is rarely dangerous, most people can learn to manage this condition on their own. HOME CARE INSTRUCTIONS Watch your back pain for any changes. The following actions may help to lessen any discomfort you are feeling:  Remain active. It is stressful on your back to sit or stand in one place for long periods of time. Do not sit, drive, or stand in one place for more than 30 minutes at a time. Take short walks on even surfaces as soon as you are able.Try to increase the length of time you walk each day.  Exercise regularly as directed by your health care provider. Exercise helps your back heal faster. It also helps avoid future injury by keeping your muscles  strong and flexible.  Do not stay in bed.Resting more than 1-2 days can delay your recovery.  Pay attention to your body when you bend and lift. The most comfortable positions are those that put less stress on your recovering back. Always use proper lifting techniques, including:  Bending your knees.  Keeping the load close to your body.  Avoiding twisting.  Find a comfortable position to sleep. Use a firm mattress and lie on your side with your knees slightly bent. If you lie on your back, put a pillow under your knees.  Avoid feeling anxious or stressed.Stress increases muscle tension and can worsen back pain.It is important to recognize when you are anxious or stressed and learn ways to manage it, such as with exercise.  Take medicines only as directed by your health care provider. Over-the-counter medicines to reduce pain and inflammation are often the most helpful.Your health care provider may prescribe muscle relaxant drugs.These medicines help dull your pain so you can more quickly return to your normal activities and healthy exercise.  Apply ice to the injured area:  Put ice in a plastic bag.  Place a towel between your skin and the bag.  Leave the ice on for 20 minutes, 2-3 times a day for the first 2-3 days. After that, ice and heat may be alternated to reduce pain and spasms.  Maintain a healthy weight. Excess weight puts extra stress on your back and makes  it difficult to maintain good posture. SEEK MEDICAL CARE IF:  You have pain that is not relieved with rest or medicine.  You have increasing pain going down into the legs or buttocks.  You have pain that does not improve in one week.  You have night pain.  You lose weight.  You have a fever or chills. SEEK IMMEDIATE MEDICAL CARE IF:   You develop new bowel or bladder control problems.  You have unusual weakness or numbness in your arms or legs.  You develop nausea or vomiting.  You develop abdominal  pain.  You feel faint.   This information is not intended to replace advice given to you by your health care provider. Make sure you discuss any questions you have with your health care provider.   Document Released: 09/06/2005 Document Revised: 09/27/2014 Document Reviewed: 01/08/2014 Elsevier Interactive Patient Education [employer].

## 2016-02-18 NOTE — ED Provider Notes (Signed)
CSN: [id number]     Arrival date & time 02/18/16  4098 History   First MD Initiated Contact with Patient 02/18/16 1056     Chief Complaint  Patient presents with  . Back Pain     (Consider location/radiation/quality/duration/timing/severity/associated sxs/prior Treatment) HPI Comments: ELLSIE [name] is a 60 y.o. female with h/o chronic back pain, SCLC, depression, and hypothyroidism presents to ED with low back pain. Patient reports slipping and falling on Sunday. She was seen in the ED, x-rays were completed showing no new fracture, and she was d/c with pain medication, muscle relaxers, and anti-inflammatories. Patient returns today because the pain is not improved, she is out of pain medication, and she is unable to work secondary to pain and movement and fears losing her job. Patient states pain is not as bad as first day; however, she struggled with getting out of bed and bending forward. She took naproxyn this morning and reports pain is 8/10, worse on the left side with no radiation. No numbness in lower extremities. No fever. No loss of bowel or bladder dysfunction. No saddle anesthesia. No urinary symptoms.   Patient is a 60 y.o. female presenting with back pain. The history is provided by the patient.  Back Pain Associated symptoms: no abdominal pain, no chest pain, no dysuria, no fever, no headaches, no numbness and no weakness     Past Medical History  Diagnosis Date  . Small cell lung cancer ([place]) 04/24/2012    Presented 01/1999 w right supraclavicular lymph node enlargement. Rx Carboplatinum/etoposide X 6, Topotecan X 2, Radiation  . 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 Acquired hypothyroidism 04/24/2012  . 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 H/O chronic ulcerative colitis 04/24/2012    Mild   . Depression with anxiety 04/24/2012  . Vertigo   . Anxiety    Past Surgical History  Procedure Laterality Date  . Laproscopy    .  Abdominal hysterectomy     Family History  Problem Relation Age of Onset  . Hypertension Mother   . High Cholesterol Mother   . Prostate cancer Father    Social History  Substance Use Topics  . Smoking status: Former Smoker    Quit date: 09/18/1999  . Smokeless tobacco: Never Used  . Alcohol Use: 0.0 oz/week    0 Standard drinks or equivalent per week     Comment: Ocasional    OB History    No data available     Review of Systems  Constitutional: Negative for fever, chills, diaphoresis and fatigue.  Eyes: Negative for visual disturbance.  Respiratory: Negative for shortness of breath.   Cardiovascular: Negative for chest pain.  Gastrointestinal: Negative for nausea, vomiting, abdominal pain, diarrhea, constipation and blood in stool.  Genitourinary: Negative for dysuria and hematuria.  Musculoskeletal: Positive for back pain. Negative for neck pain and neck stiffness.  Neurological: Negative for dizziness, weakness, light-headedness, numbness and headaches.      Allergies  Review of patient's allergies indicates no known allergies.  Home Medications   Prior to Admission medications   Medication Sig Start Date End Date Taking? Authorizing Provider  aspirin EC 81 MG tablet Take 81 mg by mouth daily.    Yes Historical Provider, MD  dimenhyDRINATE (DRAMAMINE) 50 MG tablet Take 50 mg by mouth every 8 (eight) hours as needed for dizziness.   Yes Historical Provider, MD  FLUoxetine (PROZAC) 20 MG capsule Take 60 mg by mouth  daily.   Yes Historical Provider, MD  levothyroxine (SYNTHROID, LEVOTHROID) 50 MCG tablet Take 1 tablet (50 mcg total) by mouth daily before breakfast. 10/29/15  Yes Josalyn Funches, MD  methocarbamol (ROBAXIN) 500 MG tablet Take 1 tablet (500 mg total) by mouth 4 (four) times daily. 02/15/16  Yes Dorie Rank, MD  Multiple Vitamin (MULTIVITAMIN WITH MINERALS) TABS Take 1 tablet by mouth daily.   Yes Historical Provider, MD  naproxen (NAPROSYN) 500 MG tablet Take  1 tablet (500 mg total) by mouth 2 (two) times daily. 02/15/16  Yes Dorie Rank, MD  Omega-3 Fatty Acids (FISH OIL) 1000 MG CAPS Take 1 capsule by mouth daily.    Yes Historical Provider, MD  HYDROcodone-acetaminophen (NORCO/VICODIN) 5-325 MG tablet Take 1 tablet by mouth every 6 (six) hours as needed. 02/18/16   Roxanna Mew, PA-C  meclizine (ANTIVERT) 25 MG tablet Take 1 tablet (25 mg total) by mouth 3 (three) times daily as needed for dizziness. Patient not taking: Reported on 02/18/2016 10/28/15   Boykin Nearing, MD  traMADol (ULTRAM) 50 MG tablet Take 1 tablet (50 mg total) by mouth every 8 (eight) hours as needed. Patient not taking: Reported on 02/18/2016 10/28/15   Josalyn Funches, MD   BP 133/87 mmHg  Pulse 89  Temp(Src) 98.7 F (37.1 C) (Oral)  Resp 16  Wt 89.359 kg  SpO2 96% Physical Exam  Constitutional: She appears well-developed and well-nourished. No distress.  HENT:  Head: Normocephalic and atraumatic.  Mouth/Throat: Uvula is midline, oropharynx is clear and moist and mucous membranes are normal. No trismus in the jaw. No uvula swelling.  Eyes: Conjunctivae are normal. Pupils are equal, round, and reactive to light. No scleral icterus.  Neck: Normal range of motion. Neck supple. No rigidity. Normal range of motion present.  Cardiovascular: Normal rate, normal heart sounds and intact distal pulses.   No murmur heard. Pulmonary/Chest: Effort normal and breath sounds normal. No respiratory distress. She has no wheezes.  Musculoskeletal: Normal range of motion. She exhibits no edema.  Mild TTP of left paravertebral muscles. Decreased ROM secondary to pain in particular forward flexion.   Lymphadenopathy:    She has no cervical adenopathy.  Neurological: She is alert.  Mental Status:  Alert, oriented, thought content appropriate, able to give a coherent history. Speech fluent without evidence of aphasia. Able to follow 2 step commands without difficulty.  Cranial Nerves:   II:  Peripheral visual fields grossly normal, pupils equal, round, reactive to light III,IV, VI: ptosis not present, extra-ocular motions intact bilaterally  V,VII: smile symmetric, facial light touch sensation equal VIII: hearing grossly normal to voice  X: uvula elevates symmetrically  XI: bilateral shoulder shrug symmetric and strong XII: midline tongue extension without fassiculations Motor:  Normal tone. 5/5 in upper and lower extremities bilaterally including strong and equal grip strength and dorsiflexion/plantar flexion Sensory: Pinprick and light touch normal in all extremities.  Deep Tendon Reflexes: 2+ and symmetric in the biceps and patella Cerebellar: normal finger-to-nose with bilateral upper extremities Gait: normal gait and balance CV: distal pulses palpable throughout     Skin: Skin is warm and dry. She is not diaphoretic. No erythema.  No rashes, redness, or erythema.   Psychiatric: She has a normal mood and affect. Her behavior is normal.    ED Course  Procedures (including critical care time) Labs Review Labs Reviewed - No data to display  Imaging Review No results found. I have personally reviewed and evaluated these images and lab  results as part of my medical decision-making.   EKG Interpretation None      MDM   Final diagnoses:  Left low back pain, with sciatica presence unspecified    Patient is afebrile and non-toxic appearing. Vital signs are stable. X-rays completed on 02/16/16 showed no acute fractures. Tenderness to palpation of the left paravertebral muscles. Neurologic exam normal. Doubt cauda equina or epidural abscess. Suspect musculoskeletal. Patient given Valium, steroids, and Toradol in ED.  On re-evaluation pain level 7/10. Patient is ambulatory. Prescribed 4 additional days of pain medication. Continue Robaxin and Naprosyn. Discussed symptomatic management. Provided work note. Discussed return precautions. Encouraged follow-up with  primary care doctor. Patient voiced understanding and is agreeable.    Roxanna Mew, PA-C 02/18/16 1420  Malvin Johns, MD 02/18/16 [phone]

## 2016-03-04 ENCOUNTER — Other Ambulatory Visit: Payer: Self-pay | Admitting: Family Medicine

## 2016-03-24 ENCOUNTER — Ambulatory Visit: Payer: Self-pay

## 2016-03-30 ENCOUNTER — Ambulatory Visit: Payer: Self-pay | Attending: Family Medicine | Admitting: Family Medicine

## 2016-03-30 ENCOUNTER — Encounter: Payer: Self-pay | Admitting: Family Medicine

## 2016-03-30 VITALS — BP 107/74 | HR 89 | Temp 98.2°F | Resp 16 | Ht 65.0 in | Wt 195.0 lb

## 2016-03-30 DIAGNOSIS — G8929 Other chronic pain: Secondary | ICD-10-CM | POA: Insufficient documentation

## 2016-03-30 DIAGNOSIS — M549 Dorsalgia, unspecified: Secondary | ICD-10-CM

## 2016-03-30 DIAGNOSIS — M546 Pain in thoracic spine: Secondary | ICD-10-CM

## 2016-03-30 DIAGNOSIS — Z79899 Other long term (current) drug therapy: Secondary | ICD-10-CM | POA: Insufficient documentation

## 2016-03-30 DIAGNOSIS — M4854XA Collapsed vertebra, not elsewhere classified, thoracic region, initial encounter for fracture: Secondary | ICD-10-CM | POA: Insufficient documentation

## 2016-03-30 DIAGNOSIS — Z85118 Personal history of other malignant neoplasm of bronchus and lung: Secondary | ICD-10-CM | POA: Insufficient documentation

## 2016-03-30 DIAGNOSIS — F418 Other specified anxiety disorders: Secondary | ICD-10-CM | POA: Insufficient documentation

## 2016-03-30 DIAGNOSIS — Z7982 Long term (current) use of aspirin: Secondary | ICD-10-CM | POA: Insufficient documentation

## 2016-03-30 DIAGNOSIS — Z87891 Personal history of nicotine dependence: Secondary | ICD-10-CM | POA: Insufficient documentation

## 2016-03-30 DIAGNOSIS — M545 Low back pain: Secondary | ICD-10-CM | POA: Insufficient documentation

## 2016-03-30 DIAGNOSIS — E039 Hypothyroidism, unspecified: Secondary | ICD-10-CM | POA: Insufficient documentation

## 2016-03-30 MED ORDER — FLUOXETINE HCL 20 MG PO CAPS: 20.0000 mg | ORAL_CAPSULE | Freq: Every day | ORAL | Status: DC

## 2016-03-30 MED ORDER — TRAMADOL HCL 50 MG PO TABS: 50.0000 mg | ORAL_TABLET | Freq: Three times a day (TID) | ORAL | Status: DC | PRN

## 2016-03-30 MED ORDER — DICLOFENAC SODIUM 75 MG PO TBEC: 75.0000 mg | DELAYED_RELEASE_TABLET | Freq: Two times a day (BID) | ORAL | Status: DC

## 2016-03-30 NOTE — Patient Instructions (Addendum)
Stacy [name] was seen today for back pain.  Diagnoses and all orders for this visit:  Depression with anxiety -     FLUoxetine (PROZAC) 20 MG capsule; Take 1 capsule (20 mg total) by mouth daily. Reported on 03/30/2016  Chronic low back pain -     traMADol (ULTRAM) 50 MG tablet; Take 1 tablet (50 mg total) by mouth every 8 (eight) hours as needed. -     diclofenac (VOLTAREN) 75 MG EC tablet; Take 1 tablet (75 mg total) by mouth 2 (two) times daily.  Mid back pain -     traMADol (ULTRAM) 50 MG tablet; Take 1 tablet (50 mg total) by mouth every 8 (eight) hours as needed. -     diclofenac (VOLTAREN) 75 MG EC tablet; Take 1 tablet (75 mg total) by mouth 2 (two) times daily.   Restart vit D aim for 2000 IU D3 daily Take calcium as well 400 mg of cal citrate or similar daily   F/u in 8 weeks for back pain and hypothyroidism   Dr. Adrian Blackwater

## 2016-03-30 NOTE — Progress Notes (Signed)
F/U Back pain, Hx injury  Pt stated had a fall 3 weeks ago, continues with pain  Pain scale # 5 Pain worsen with movement and standing for a long period of time  No suicidal thoughts in the past two weeks  Former smoker

## 2016-03-30 NOTE — Progress Notes (Signed)
Subjective:  Patient ID: Stacy [name], female    DOB: 10-Oct-1955  Age: 60 y.o. MRN: [id number]  CC: Back Pain   HPI Stacy [name], Stacy [name], Stacy and anxiety she  presents [name]   1. Back pain: chronic. Low back. Worsened 6 weeks ago following slip and fall onto back at home. She went to ED. Thoracic x-ray revealed stable chronic T11 compression fracture. Pain is exacerbated by movement and standing too long.  2. Stacy: she is no longer taking prozac. She has tearfulness and irritability.     Social History  Substance Use Topics  . Smoking status: Former Smoker    Quit date: 09/18/1999  . Smokeless tobacco: Never Used  . Alcohol Use: 0.0 oz/week    0 Standard drinks or equivalent per week     Comment: Ocasional     Outpatient Prescriptions Prior to Visit  Medication Sig Dispense Refill  . aspirin EC 81 MG tablet Take 81 mg by mouth daily.     Marland Kitchen levothyroxine (SYNTHROID, LEVOTHROID) 50 MCG tablet TAKE ONE TABLET BY MOUTH ONCE DAILY BEFORE BREAKFAST 30 tablet 2  . Multiple Vitamin (MULTIVITAMIN WITH MINERALS) TABS Take 1 tablet by mouth daily.    . Omega-3 Fatty Acids (FISH OIL) 1000 MG CAPS Take 1 capsule by mouth daily.     Marland Kitchen dimenhyDRINATE (DRAMAMINE) 50 MG tablet Take 50 mg by mouth every 8 (eight) hours as needed [name] dizziness. Reported on 03/30/2016    . FLUoxetine (PROZAC) 20 MG capsule Take 60 mg by mouth daily. Reported on 03/30/2016    . HYDROcodone-acetaminophen (NORCO/VICODIN) 5-325 MG tablet Take 1 tablet by mouth every 6 (six) hours as needed. (Patient not taking: Reported on 03/30/2016) 12 tablet 0  . meclizine (ANTIVERT) 25 MG tablet Take 1 tablet (25 mg total) by mouth 3 (three) times daily as needed [name] dizziness. (Patient not taking: Reported on 03/30/2016) 30 tablet 0  . methocarbamol (ROBAXIN) 500 MG tablet Take 1 tablet (500 mg total) by mouth 4 (four) times daily. (Patient not taking: Reported on 03/30/2016) 20  tablet 0  . naproxen (NAPROSYN) 500 MG tablet Take 1 tablet (500 mg total) by mouth 2 (two) times daily. (Patient not taking: Reported on 03/30/2016) 30 tablet 0  . traMADol (ULTRAM) 50 MG tablet Take 1 tablet (50 mg total) by mouth every 8 (eight) hours as needed. (Patient not taking: Reported on 03/30/2016) 60 tablet 0   No facility-administered medications prior to visit.    ROS Review of Systems  Constitutional: Negative [name] fever and chills.  HENT: Negative [name] ear pain.   Eyes: Negative [name] visual disturbance.  Respiratory: Negative [name] shortness of breath.   Cardiovascular: Negative [name] chest pain.  Gastrointestinal: Negative [name] abdominal pain and blood in stool.  Musculoskeletal: Positive [name] back pain and neck pain. Negative [name] arthralgias.  Skin: Negative [name] rash.  Allergic/Immunologic: Negative [name] immunocompromised state.  Neurological: Negative [name] dizziness.  Hematological: Negative [name] adenopathy. Does not bruise/bleed easily.  Psychiatric/Behavioral: Positive [name] dysphoric mood. Negative [name] suicidal ideas. The patient is nervous/anxious.     Objective:  BP 107/74 mmHg  Pulse 89  Temp(Src) 98.2 F (36.8 C) (Oral)  Resp 16  Ht '5\' 5"'$  (1.651 m)  Wt 195 lb (88.451 kg)  BMI 32.45 kg/m2  SpO2 95%  BP/Weight 03/30/2016 02/18/2016 9/76/7341  Systolic BP 937 902 409  Diastolic BP 74 87 96  Wt. (Lbs) 195 197 197  BMI  32.45 32.78 32.78    Physical Exam  Constitutional: She is oriented to person, place, and time. She appears well-developed and well-nourished. No distress.  HENT:  Head: Normocephalic and atraumatic.  Cardiovascular: Normal rate, regular rhythm, normal heart sounds and intact distal pulses.   Pulmonary/Chest: Effort normal and breath sounds normal.  Musculoskeletal: She exhibits no edema.       Thoracic back: She exhibits decreased range of motion, tenderness, bony tenderness, pain and spasm. She exhibits no swelling, no edema, no deformity and no  laceration.  Neurological: She is alert and oriented to person, place, and time.  Skin: Skin is warm and dry. No rash noted.  Psychiatric: She has a normal mood and affect.    Stacy screen [facility] 2/9 03/30/2016 10/28/2015 09/30/2015 08/21/2014 03/30/2013  Decreased Interest 1 0 1 0 0  Down, Depressed, Hopeless 1 1 0 0 0  PHQ - 2 Score '2 1 1 '$ 0 0  Altered sleeping '1 3 2 '$ - -  Tired, decreased energy '1 1 1 '$ - -  Change in appetite 1 2 0 - -  Feeling bad or failure about yourself  2 1 0 - -  Trouble concentrating 1 0 0 - -  Moving slowly or fidgety/restless 0 0 0 - -  Suicidal thoughts 0 0 0 - -  PHQ-9 Score '8 8 4 '$ - -    GAD 7 : Generalized Anxiety Score 03/30/2016 10/28/2015  Nervous, Anxious, on Edge 2 1  Control/stop worrying 2 1  Worry too much - different things 2 1  Trouble relaxing 2 0  Restless 1 0  Easily annoyed or irritable 1 1  Afraid - awful might happen 1 0  Total GAD 7 Score 11 4     Assessment & Plan:   There are no diagnoses linked to this encounter. [place] was seen today [name] back pain.  Diagnoses and all orders [name] this visit:  Stacy with anxiety -     FLUoxetine (PROZAC) 20 MG capsule; Take 1 capsule (20 mg total) by mouth daily. Reported on 03/30/2016  Chronic low back pain -     traMADol (ULTRAM) 50 MG tablet; Take 1 tablet (50 mg total) by mouth every 8 (eight) hours as needed. -     diclofenac (VOLTAREN) 75 MG EC tablet; Take 1 tablet (75 mg total) by mouth 2 (two) times daily.  Mid back pain -     traMADol (ULTRAM) 50 MG tablet; Take 1 tablet (50 mg total) by mouth every 8 (eight) hours as needed. -     diclofenac (VOLTAREN) 75 MG EC tablet; Take 1 tablet (75 mg total) by mouth 2 (two) times daily.   Meds ordered this encounter  Medications  . FLUoxetine (PROZAC) 20 MG capsule    Sig: Take 1 capsule (20 mg total) by mouth daily. Reported on 03/30/2016    Dispense:  30 capsule    Refill:  2  . traMADol (ULTRAM) 50 MG tablet    Sig: Take 1  tablet (50 mg total) by mouth every 8 (eight) hours as needed.    Dispense:  60 tablet    Refill:  0  . diclofenac (VOLTAREN) 75 MG EC tablet    Sig: Take 1 tablet (75 mg total) by mouth 2 (two) times daily.    Dispense:  60 tablet    Refill:  0    Follow-up: Return in about 2 months (around 05/31/2016).   Boykin Nearing MD

## 2016-04-01 NOTE — Assessment & Plan Note (Signed)
Depression and anxiety Restart prozac at 20 mg daily

## 2016-04-01 NOTE — Assessment & Plan Note (Signed)
Thoracic back pain, stable T11 compression fracture  Add tramadol Add diclofenac Add vit D and calcium OTC

## 2016-06-22 ENCOUNTER — Ambulatory Visit (HOSPITAL_COMMUNITY)
Admission: RE | Admit: 2016-06-22 | Discharge: 2016-06-22 | Disposition: A | Discharge status: Home / Self Care | Payer: Self-pay | Source: Ambulatory Visit | Attending: Family Medicine | Admitting: Family Medicine

## 2016-06-22 ENCOUNTER — Encounter: Payer: Self-pay | Admitting: Family Medicine

## 2016-06-22 ENCOUNTER — Ambulatory Visit: Payer: Self-pay | Attending: Family Medicine | Admitting: Family Medicine

## 2016-06-22 VITALS — BP 121/82 | HR 93 | Temp 98.4°F | Resp 16 | Wt 188.0 lb

## 2016-06-22 DIAGNOSIS — Z85118 Personal history of other malignant neoplasm of bronchus and lung: Secondary | ICD-10-CM | POA: Insufficient documentation

## 2016-06-22 DIAGNOSIS — R200 Anesthesia of skin: Secondary | ICD-10-CM | POA: Insufficient documentation

## 2016-06-22 DIAGNOSIS — M1288 Other specific arthropathies, not elsewhere classified, other specified site: Secondary | ICD-10-CM | POA: Insufficient documentation

## 2016-06-22 DIAGNOSIS — F418 Other specified anxiety disorders: Secondary | ICD-10-CM | POA: Insufficient documentation

## 2016-06-22 DIAGNOSIS — M546 Pain in thoracic spine: Secondary | ICD-10-CM | POA: Insufficient documentation

## 2016-06-22 DIAGNOSIS — S22080D Wedge compression fracture of T11-T12 vertebra, subsequent encounter for fracture with routine healing: Secondary | ICD-10-CM

## 2016-06-22 DIAGNOSIS — Z7982 Long term (current) use of aspirin: Secondary | ICD-10-CM | POA: Insufficient documentation

## 2016-06-22 DIAGNOSIS — G8929 Other chronic pain: Secondary | ICD-10-CM | POA: Insufficient documentation

## 2016-06-22 DIAGNOSIS — Z87891 Personal history of nicotine dependence: Secondary | ICD-10-CM | POA: Insufficient documentation

## 2016-06-22 DIAGNOSIS — W010XXA Fall on same level from slipping, tripping and stumbling without subsequent striking against object, initial encounter: Secondary | ICD-10-CM | POA: Insufficient documentation

## 2016-06-22 DIAGNOSIS — M4856XA Collapsed vertebra, not elsewhere classified, lumbar region, initial encounter for fracture: Secondary | ICD-10-CM | POA: Insufficient documentation

## 2016-06-22 DIAGNOSIS — F329 Major depressive disorder, single episode, unspecified: Secondary | ICD-10-CM | POA: Insufficient documentation

## 2016-06-22 DIAGNOSIS — M545 Low back pain: Secondary | ICD-10-CM | POA: Insufficient documentation

## 2016-06-22 DIAGNOSIS — M549 Dorsalgia, unspecified: Secondary | ICD-10-CM | POA: Insufficient documentation

## 2016-06-22 DIAGNOSIS — Z9889 Other specified postprocedural states: Secondary | ICD-10-CM | POA: Insufficient documentation

## 2016-06-22 DIAGNOSIS — S32010D Wedge compression fracture of first lumbar vertebra, subsequent encounter for fracture with routine healing: Secondary | ICD-10-CM

## 2016-06-22 DIAGNOSIS — E039 Hypothyroidism, unspecified: Secondary | ICD-10-CM | POA: Insufficient documentation

## 2016-06-22 DIAGNOSIS — M4854XA Collapsed vertebra, not elsewhere classified, thoracic region, initial encounter for fracture: Secondary | ICD-10-CM | POA: Insufficient documentation

## 2016-06-22 MED ORDER — LEVOTHYROXINE SODIUM 50 MCG PO TABS: 50.0000 ug | ORAL_TABLET | Freq: Every day | ORAL | 11 refills | Status: DC

## 2016-06-22 MED ORDER — MELOXICAM 15 MG PO TABS: 15.0000 mg | ORAL_TABLET | Freq: Every day | ORAL | 2 refills | Status: DC

## 2016-06-22 MED ORDER — GABAPENTIN 100 MG PO CAPS: ORAL_CAPSULE | ORAL | 1 refills | Status: DC

## 2016-06-22 MED ORDER — CALCIUM CITRATE 200 MG PO TABS: 400.0000 mg | ORAL_TABLET | Freq: Every day | ORAL | 11 refills | Status: DC

## 2016-06-22 MED ORDER — FLUOXETINE HCL 20 MG PO CAPS: 20.0000 mg | ORAL_CAPSULE | Freq: Every day | ORAL | 11 refills | Status: DC

## 2016-06-22 NOTE — Patient Instructions (Addendum)
Stacy [name] was seen today for back pain.  Diagnoses and all orders for this visit:  Acquired hypothyroidism -     levothyroxine (SYNTHROID, LEVOTHROID) 50 MCG tablet; Take 1 tablet (50 mcg total) by mouth daily before breakfast.  Depression with anxiety -     FLUoxetine (PROZAC) 20 MG capsule; Take 1 capsule (20 mg total) by mouth daily. Reported on 03/30/2016  Traumatic compression fracture of T11 thoracic vertebra with routine healing, subsequent encounter -     COMPLETE METABOLIC PANEL WITH GFR -     meloxicam (MOBIC) 15 MG tablet; Take 1 tablet (15 mg total) by mouth daily. -     gabapentin (NEURONTIN) 100 MG capsule; 100 mg nightly for one week, 200 mg nightly for one week, then 300 mg nightly -     DG Thoracic Spine 2 View; Future -     Calcium Citrate 200 MG TABS; Take 2 tablets (400 mg total) by mouth daily. -     Ambulatory referral to Physical Therapy  Closed compression fracture of L1 lumbar vertebra with routine healing, subsequent encounter -     DG Lumbar Spine Complete; Future -     Ambulatory referral to Physical Therapy   Start daily mobic with food Nightly gabapentin Add calcium to vit D Back x-rays Physical therapy  F/u in 6 weeks for back pain   Dr. Adrian Blackwater

## 2016-06-22 NOTE — Assessment & Plan Note (Signed)
Improved Continue prozac 20 mg daily

## 2016-06-22 NOTE — Addendum Note (Signed)
Addended by: Boykin Nearing on: 06/22/2016 05:40 PM   Modules accepted: Orders

## 2016-06-22 NOTE — Progress Notes (Signed)
Pt is in the office today for back pain Pt states her pain level today in the office is a 7 Pt states her torso, spine and butt hurts Pt states she feels that her hip is getting numb but doesn't know if it is from her back pain Pt states she needs medication refill

## 2016-06-22 NOTE — Assessment & Plan Note (Signed)
A:  Worsening pain following fall in setting of L1 compression fracture  P: Add daily mobic Nightly gabapentin Stop tramadol PT Repeat x-rays Add calcium to vit D given hx of compression fractures

## 2016-06-22 NOTE — Assessment & Plan Note (Signed)
A:  Worsening pain following fall in setting of T11 compression fracture  P: Add daily mobic Nightly gabapentin Stop tramadol PT Repeat x-rays Add calcium to vit D given hx of compression fractures

## 2016-06-22 NOTE — Progress Notes (Signed)
Subjective:  Patient ID: Stacy [name], female    DOB: October 28, 1955  Age: 60 y.o. MRN: [id number]  CC: Back Pain   HPI Alisa Stjames Copenhaver has hx of small cell lung CA, hypothyroidism, depression and anxiety she  presents for   1. Back pain: worsening back pain. She has hx of of lumbar back pain, but pain worsened following slip and fall onto back at home on 02/15/2016. She went to ED. Thoracic and lumbar x-ray revealed stable chronic T11 and L1 compression fractures. She saw me in follow up and I added tramadol, diclofenac. She is taking daily vit D. Today, she reports that pain is worsening. She had to take two tramadol at night. Voltaren did not help. She is now taking excedrin multiple times a day.  Pain is exacerbated by movement and standing too long. Pain is in low back at times central, and at times on flanks. Also with some numbness in L hip. She is working and her pain is severe at the end of her work day.   2. Depression: she is taking prozac. Mood has improved. Request refill. Denies SI.   Social History  Substance Use Topics  . Smoking status: Former Smoker    Quit date: 09/18/1999  . Smokeless tobacco: Never Used  . Alcohol use 0.0 oz/week     Comment: Ocasional     Outpatient Medications Prior to Visit  Medication Sig Dispense Refill  . FLUoxetine (PROZAC) 20 MG capsule Take 1 capsule (20 mg total) by mouth daily. Reported on 03/30/2016 30 capsule 2  . levothyroxine (SYNTHROID, LEVOTHROID) 50 MCG tablet TAKE ONE TABLET BY MOUTH ONCE DAILY BEFORE BREAKFAST 30 tablet 2  . Multiple Vitamin (MULTIVITAMIN WITH MINERALS) TABS Take 1 tablet by mouth daily.    . Omega-3 Fatty Acids (FISH OIL) 1000 MG CAPS Take 1 capsule by mouth daily.     Marland Kitchen aspirin EC 81 MG tablet Take 81 mg by mouth daily.     . diclofenac (VOLTAREN) 75 MG EC tablet Take 1 tablet (75 mg total) by mouth 2 (two) times daily. (Patient not taking: Reported on 06/22/2016) 60 tablet 0  . dimenhyDRINATE (DRAMAMINE) 50  MG tablet Take 50 mg by mouth every 8 (eight) hours as needed for dizziness. Reported on 03/30/2016    . meclizine (ANTIVERT) 25 MG tablet Take 1 tablet (25 mg total) by mouth 3 (three) times daily as needed for dizziness. (Patient not taking: Reported on 06/22/2016) 30 tablet 0  . traMADol (ULTRAM) 50 MG tablet Take 1 tablet (50 mg total) by mouth every 8 (eight) hours as needed. (Patient not taking: Reported on 06/22/2016) 60 tablet 0   No facility-administered medications prior to visit.     ROS Review of Systems  Constitutional: Negative for chills and fever.  HENT: Negative for ear pain.   Eyes: Negative for visual disturbance.  Respiratory: Negative for shortness of breath.   Cardiovascular: Negative for chest pain.  Gastrointestinal: Negative for abdominal pain and blood in stool.  Musculoskeletal: Positive for back pain and neck pain. Negative for arthralgias.  Skin: Negative for rash.  Allergic/Immunologic: Negative for immunocompromised state.  Neurological: Negative for dizziness.  Hematological: Negative for adenopathy. Does not bruise/bleed easily.  Psychiatric/Behavioral: Positive for dysphoric mood. Negative for suicidal ideas. The patient is nervous/anxious.     Objective:  BP 121/82 (BP Location: Right Arm, Patient Position: Sitting, Cuff Size: Large)   Pulse 93   Temp 98.4 F (36.9 C) (Oral)  Resp 16   Wt 188 lb (85.3 kg)   SpO2 96%   BMI 31.28 kg/m   BP/Weight 06/22/2016 03/30/2016 03/22/5008  Systolic BP 381 829 937  Diastolic BP 82 74 87  Wt. (Lbs) 188 195 197  BMI 31.28 32.45 32.78    Physical Exam  Constitutional: She is oriented to person, place, and time. She appears well-developed and well-nourished. No distress.  HENT:  Head: Normocephalic and atraumatic.  Cardiovascular: Normal rate, regular rhythm, normal heart sounds and intact distal pulses.   Pulmonary/Chest: Effort normal and breath sounds normal.  Musculoskeletal: She exhibits no edema.        Thoracic back: She exhibits decreased range of motion, tenderness, bony tenderness, pain and spasm. She exhibits no swelling, no edema, no deformity and no laceration.  Neurological: She is alert and oriented to person, place, and time.  Skin: Skin is warm and dry. No rash noted.  Psychiatric: She has a normal mood and affect.    Depression screen [facility] 2/9 06/22/2016 03/30/2016 10/28/2015 09/30/2015 08/21/2014  Decreased Interest 1 1 0 1 0  Down, Depressed, Hopeless '1 1 1 '$ 0 0  PHQ - 2 Score '2 2 1 1 '$ 0  Altered sleeping '2 1 3 2 '$ -  Tired, decreased energy '1 1 1 1 '$ -  Change in appetite 0 1 2 0 -  Feeling bad or failure about yourself  '2 2 1 '$ 0 -  Trouble concentrating 0 1 0 0 -  Moving slowly or fidgety/restless 0 0 0 0 -  Suicidal thoughts 0 0 0 0 -  PHQ-9 Score '7 8 8 4 '$ -    GAD 7 : Generalized Anxiety Score 06/22/2016 03/30/2016 10/28/2015  Nervous, Anxious, on Edge 0 2 1  Control/stop worrying '1 2 1  '$ Worry too much - different things '1 2 1  '$ Trouble relaxing 0 2 0  Restless 0 1 0  Easily annoyed or irritable '1 1 1  '$ Afraid - awful might happen 0 1 0  Total GAD 7 Score '3 11 4   '$ Lab Results  Component Value Date   TSH 1.74 10/28/2015    Assessment & Plan:   There are no diagnoses linked to this encounter. Jeraline was seen today for back pain.  Diagnoses and all orders for this visit:  Acquired hypothyroidism -     levothyroxine (SYNTHROID, LEVOTHROID) 50 MCG tablet; Take 1 tablet (50 mcg total) by mouth daily before breakfast.  Depression with anxiety -     FLUoxetine (PROZAC) 20 MG capsule; Take 1 capsule (20 mg total) by mouth daily. Reported on 03/30/2016  Traumatic compression fracture of T11 thoracic vertebra with routine healing, subsequent encounter -     COMPLETE METABOLIC PANEL WITH GFR -     meloxicam (MOBIC) 15 MG tablet; Take 1 tablet (15 mg total) by mouth daily. -     gabapentin (NEURONTIN) 100 MG capsule; 100 mg nightly for one week, 200 mg nightly for one week,  then 300 mg nightly -     DG Thoracic Spine 2 View; Future -     Calcium Citrate 200 MG TABS; Take 2 tablets (400 mg total) by mouth daily. -     Ambulatory referral to Physical Therapy  Closed compression fracture of L1 lumbar vertebra with routine healing, subsequent encounter -     DG Lumbar Spine Complete; Future -     Ambulatory referral to Physical Therapy  Chronic bilateral thoracic back pain   No orders of the  defined types were placed in this encounter.   Follow-up: Return in about 6 weeks (around 08/03/2016) for back pain .   Boykin Nearing MD

## 2016-06-23 ENCOUNTER — Ambulatory Visit: Payer: Self-pay

## 2016-06-23 LAB — COMPLETE METABOLIC PANEL WITH GFR
ALT: 11 U/L (ref 6–29)
AST: 14 U/L (ref 10–35)
Albumin: 4.3 g/dL (ref 3.6–5.1)
Alkaline Phosphatase: 54 U/L (ref 33–130)
BUN: 13 mg/dL (ref 7–25)
CALCIUM: 9.5 mg/dL (ref 8.6–10.4)
CHLORIDE: 102 mmol/L (ref 98–110)
CO2: 25 mmol/L (ref 20–31)
CREATININE: 0.48 mg/dL — AB (ref 0.50–1.05)
Glucose, Bld: 87 mg/dL (ref 65–99)
POTASSIUM: 4.4 mmol/L (ref 3.5–5.3)
Sodium: 137 mmol/L (ref 135–146)
Total Bilirubin: 0.3 mg/dL (ref 0.2–1.2)
Total Protein: 7.7 g/dL (ref 6.1–8.1)

## 2016-06-24 ENCOUNTER — Encounter: Payer: Self-pay | Admitting: Family Medicine

## 2016-07-12 NOTE — Telephone Encounter (Signed)
Patient called the office to check on the status on the email that she wrote to PCP through [facility]. Please follow up.  Thank you

## 2016-07-30 ENCOUNTER — Encounter: Payer: Self-pay | Admitting: Family Medicine

## 2016-07-30 ENCOUNTER — Ambulatory Visit: Payer: Self-pay | Attending: Family Medicine | Admitting: Family Medicine

## 2016-07-30 VITALS — BP 114/79 | HR 87 | Temp 98.7°F | Ht 65.0 in | Wt 186.2 lb

## 2016-07-30 DIAGNOSIS — Z79899 Other long term (current) drug therapy: Secondary | ICD-10-CM | POA: Insufficient documentation

## 2016-07-30 DIAGNOSIS — Z7982 Long term (current) use of aspirin: Secondary | ICD-10-CM | POA: Insufficient documentation

## 2016-07-30 DIAGNOSIS — F419 Anxiety disorder, unspecified: Secondary | ICD-10-CM | POA: Insufficient documentation

## 2016-07-30 DIAGNOSIS — Z85118 Personal history of other malignant neoplasm of bronchus and lung: Secondary | ICD-10-CM | POA: Insufficient documentation

## 2016-07-30 DIAGNOSIS — G8929 Other chronic pain: Secondary | ICD-10-CM

## 2016-07-30 DIAGNOSIS — Z87891 Personal history of nicotine dependence: Secondary | ICD-10-CM | POA: Insufficient documentation

## 2016-07-30 DIAGNOSIS — F329 Major depressive disorder, single episode, unspecified: Secondary | ICD-10-CM | POA: Insufficient documentation

## 2016-07-30 DIAGNOSIS — E039 Hypothyroidism, unspecified: Secondary | ICD-10-CM | POA: Insufficient documentation

## 2016-07-30 DIAGNOSIS — R200 Anesthesia of skin: Secondary | ICD-10-CM | POA: Insufficient documentation

## 2016-07-30 DIAGNOSIS — M545 Low back pain: Secondary | ICD-10-CM | POA: Insufficient documentation

## 2016-07-30 MED ORDER — CELECOXIB 100 MG PO CAPS: 100.0000 mg | ORAL_CAPSULE | Freq: Two times a day (BID) | ORAL | 0 refills | Status: DC

## 2016-07-30 MED ORDER — CELECOXIB 100 MG PO CAPS: 100.0000 mg | ORAL_CAPSULE | Freq: Two times a day (BID) | ORAL | 2 refills | Status: DC

## 2016-07-30 MED ORDER — TRAMADOL HCL 50 MG PO TABS: 50.0000 mg | ORAL_TABLET | Freq: Every day | ORAL | 0 refills | Status: DC

## 2016-07-30 NOTE — Progress Notes (Signed)
Subjective:  Patient ID: Stacy [name], female    DOB: 1955-09-25  Age: 60 y.o. MRN: [id number]  CC: Back Pain   HPI Kimbly Eanes Selvey has hx of small cell lung CA, hypothyroidism, depression and anxiety she  presents for   1. Back pain: worsening back pain. She has hx of of lumbar back pain, but pain worsened following slip and fall onto back at home on 02/15/2016. She went to ED. Thoracic and lumbar x-ray revealed stable chronic T11 and L1 compression fractures. She saw me in follow up and I added tramadol, diclofenac. She is taking daily vit D. Today, she reports that pain is worsening. She had to take two tramadol at night. Voltaren and mobic did not help. She is now taking excedrin multiple times a day.  Pain is exacerbated by movement and standing too long. Pain is in low back at times central, and at times on flanks. Also with some numbness in L hip. She is working and her pain is severe at the end of her work day.   2. Depression: she is taking prozac. Mood has improved.  Denies SI.   Social History  Substance Use Topics  . Smoking status: Former Smoker    Quit date: 09/18/1999  . Smokeless tobacco: Never Used  . Alcohol use 0.0 oz/week     Comment: Ocasional     Outpatient Medications Prior to Visit  Medication Sig Dispense Refill  . dimenhyDRINATE (DRAMAMINE) 50 MG tablet Take 50 mg by mouth every 8 (eight) hours as needed for dizziness. Reported on 03/30/2016    . FLUoxetine (PROZAC) 20 MG capsule Take 1 capsule (20 mg total) by mouth daily. Reported on 03/30/2016 30 capsule 11  . gabapentin (NEURONTIN) 100 MG capsule 100 mg nightly for one week, 200 mg nightly for one week, then 300 mg nightly 90 capsule 1  . levothyroxine (SYNTHROID, LEVOTHROID) 50 MCG tablet Take 1 tablet (50 mcg total) by mouth daily before breakfast. 30 tablet 11  . Multiple Vitamin (MULTIVITAMIN WITH MINERALS) TABS Take 1 tablet by mouth daily.    . Omega-3 Fatty Acids (FISH OIL) 1000 MG CAPS Take 1  capsule by mouth daily.     Marland Kitchen aspirin EC 81 MG tablet Take 81 mg by mouth daily.     . Calcium Citrate 200 MG TABS Take 2 tablets (400 mg total) by mouth daily. (Patient not taking: Reported on 07/30/2016) 60 tablet 11  . meloxicam (MOBIC) 15 MG tablet Take 1 tablet (15 mg total) by mouth daily. (Patient not taking: Reported on 07/30/2016) 30 tablet 2   No facility-administered medications prior to visit.     ROS Review of Systems  Constitutional: Negative for chills and fever.  HENT: Negative for ear pain.   Eyes: Negative for visual disturbance.  Respiratory: Negative for shortness of breath.   Cardiovascular: Negative for chest pain.  Gastrointestinal: Negative for abdominal pain and blood in stool.  Musculoskeletal: Positive for back pain and neck pain. Negative for arthralgias.  Skin: Negative for rash.  Allergic/Immunologic: Negative for immunocompromised state.  Neurological: Negative for dizziness.  Hematological: Negative for adenopathy. Does not bruise/bleed easily.  Psychiatric/Behavioral: Positive for dysphoric mood. Negative for suicidal ideas. The patient is nervous/anxious.     Objective:  BP 114/79 (BP Location: Left Arm, Patient Position: Sitting, Cuff Size: Small)   Pulse 87   Temp 98.7 F (37.1 C) (Oral)   Ht '5\' 5"'$  (1.651 m)   Wt 186 lb 3.2 oz (  84.5 kg)   SpO2 95%   BMI 30.99 kg/m   BP/Weight 07/30/2016 06/22/2016 03/10/3085  Systolic BP 578 469 629  Diastolic BP 79 82 74  Wt. (Lbs) 186.2 188 195  BMI 30.99 31.28 32.45    Physical Exam  Constitutional: She is oriented to person, place, and time. She appears well-developed and well-nourished. No distress.  HENT:  Head: Normocephalic and atraumatic.  Cardiovascular: Normal rate, regular rhythm, normal heart sounds and intact distal pulses.   Pulmonary/Chest: Effort normal and breath sounds normal.  Musculoskeletal: She exhibits no edema.       Thoracic back: She exhibits decreased range of motion,  tenderness, bony tenderness, pain and spasm. She exhibits no swelling, no edema, no deformity and no laceration.  Neurological: She is alert and oriented to person, place, and time.  Skin: Skin is warm and dry. No rash noted.  Psychiatric: She has a normal mood and affect.    Depression screen [facility] 2/9 07/30/2016 06/22/2016 03/30/2016 10/28/2015 09/30/2015  Decreased Interest 0 1 1 0 1  Down, Depressed, Hopeless '2 1 1 1 '$ 0  PHQ - 2 Score '2 2 2 1 1  '$ Altered sleeping '2 2 1 3 2  '$ Tired, decreased energy '1 1 1 1 1  '$ Change in appetite 0 0 1 2 0  Feeling bad or failure about yourself  0 '2 2 1 '$ 0  Trouble concentrating 0 0 1 0 0  Moving slowly or fidgety/restless 0 0 0 0 0  Suicidal thoughts 0 0 0 0 0  PHQ-9 Score '5 7 8 8 4    '$ GAD 7 : Generalized Anxiety Score 07/30/2016 06/22/2016 03/30/2016 10/28/2015  Nervous, Anxious, on Edge 0 0 2 1  Control/stop worrying '1 1 2 1  '$ Worry too much - different things '1 1 2 1  '$ Trouble relaxing 1 0 2 0  Restless 0 0 1 0  Easily annoyed or irritable '1 1 1 1  '$ Afraid - awful might happen 0 0 1 0  Total GAD 7 Score '4 3 11 4   '$ Lab Results  Component Value Date   TSH 1.74 10/28/2015    Assessment & Plan:   There are no diagnoses linked to this encounter. [place] was seen today for back pain.  Diagnoses and all orders for this visit:  Chronic bilateral low back pain without sciatica -     traMADol (ULTRAM) 50 MG tablet; Take 1 tablet (50 mg total) by mouth at bedtime. -     Discontinue: celecoxib (CELEBREX) 100 MG capsule; Take 1 capsule (100 mg total) by mouth 2 (two) times daily. -     celecoxib (CELEBREX) 100 MG capsule; Take 1 capsule (100 mg total) by mouth 2 (two) times daily.   Meds ordered this encounter  Medications  . traMADol (ULTRAM) 50 MG tablet    Sig: Take 1 tablet (50 mg total) by mouth at bedtime.    Dispense:  60 tablet    Refill:  0  . DISCONTD: celecoxib (CELEBREX) 100 MG capsule    Sig: Take 1 capsule (100 mg total) by mouth 2 (two)  times daily.    Dispense:  60 capsule    Refill:  0  . celecoxib (CELEBREX) 100 MG capsule    Sig: Take 1 capsule (100 mg total) by mouth 2 (two) times daily.    Dispense:  60 capsule    Refill:  2    Follow-up: Return in about 6 weeks (around 09/10/2016) for chronic back  pain .   Boykin Nearing MD

## 2016-07-30 NOTE — Patient Instructions (Addendum)
Stacy [name] was seen today for back pain.  Diagnoses and all orders for this visit:  Chronic bilateral low back pain without sciatica -     traMADol (ULTRAM) 50 MG tablet; Take 1 tablet (50 mg total) by mouth at bedtime. -     celecoxib (CELEBREX) 100 MG capsule; Take 1 capsule (100 mg total) by mouth 2 (two) times daily.   F/u in 4-6 weeks for back pain  Dr. Adrian Blackwater  Low Back Sprain With Rehab A sprain is an injury in which a ligament is torn. The ligaments of the lower back are vulnerable to sprains. However, they are strong and require great force to be injured. These ligaments are important for stabilizing the spinal column. Sprains are classified into three categories. Grade 1 sprains cause pain, but the tendon is not lengthened. Grade 2 sprains include a lengthened ligament, due to the ligament being stretched or partially ruptured. With grade 2 sprains there is still function, although the function may be decreased. Grade 3 sprains involve a complete tear of the tendon or muscle, and function is usually impaired. SYMPTOMS   Severe pain in the lower back.  Sometimes, a feeling of a "pop," "snap," or tear, at the time of injury.  Tenderness and sometimes swelling at the injury site.  Uncommonly, bruising (contusion) within 48 hours of injury.  Muscle spasms in the back. CAUSES  Low back sprains occur when a force is placed on the ligaments that is greater than they can handle. Common causes of injury include:  Performing a stressful act while off-balance.  Repetitive stressful activities that involve movement of the lower back.  Direct hit (trauma) to the lower back. RISK INCREASES WITH:  Contact sports (football, wrestling).  Collisions (major skiing accidents).  Sports that require throwing or lifting (baseball, weightlifting).  Sports involving twisting of the spine (gymnastics, diving, tennis, golf).  Poor strength and flexibility.  Inadequate  protection.  Previous back injury or surgery (especially fusion). PREVENTION  Wear properly fitted and padded protective equipment.  Warm up and stretch properly before activity.  Allow for adequate recovery between workouts.  Maintain physical fitness:  Strength, flexibility, and endurance.  Cardiovascular fitness.  Maintain a healthy body weight. PROGNOSIS  If treated properly, low back sprains usually heal with non-surgical treatment. The length of time for healing depends on the severity of the injury.  RELATED COMPLICATIONS   Recurring symptoms, resulting in a chronic problem.  Chronic inflammation and pain in the low back.  Delayed healing or resolution of symptoms, especially if activity is resumed too soon.  Prolonged impairment.  Unstable or arthritic joints of the low back. TREATMENT  Treatment first involves the use of ice and medicine, to reduce pain and inflammation. The use of strengthening and stretching exercises may help reduce pain with activity. These exercises may be performed at home or with a therapist. Severe injuries may require referral to a therapist for further evaluation and treatment, such as ultrasound. Your caregiver may advise that you wear a back brace or corset, to help reduce pain and discomfort. Often, prolonged bed rest results in greater harm then benefit. Corticosteroid injections may be recommended. However, these should be reserved for the most serious cases. It is important to avoid using your back when lifting objects. At night, sleep on your back on a firm mattress, with a pillow placed under your knees. If non-surgical treatment is unsuccessful, surgery may be needed.  MEDICATION   If pain medicine is needed, nonsteroidal anti-inflammatory  medicines (aspirin and ibuprofen), or other minor pain relievers (acetaminophen), are often advised.  Do not take pain medicine for 7 days before surgery.  Prescription pain relievers may be given,  if your caregiver thinks they are needed. Use only as directed and only as much as you need.  Ointments applied to the skin may be helpful.  Corticosteroid injections may be given by your caregiver. These injections should be reserved for the most serious cases, because they may only be given a certain number of times. HEAT AND COLD  Cold treatment (icing) should be applied for 10 to 15 minutes every 2 to 3 hours for inflammation and pain, and immediately after activity that aggravates your symptoms. Use ice packs or an ice massage.  Heat treatment may be used before performing stretching and strengthening activities prescribed by your caregiver, physical therapist, or athletic trainer. Use a heat pack or a warm water soak. SEEK MEDICAL CARE IF:   Symptoms get worse or do not improve in 2 to 4 weeks, despite treatment.  You develop numbness or weakness in either leg.  You lose bowel or bladder function.  Any of the following occur after surgery: fever, increased pain, swelling, redness, drainage of fluids, or bleeding in the affected area.  New, unexplained symptoms develop. (Drugs used in treatment may produce side effects.) EXERCISES  RANGE OF MOTION (ROM) AND STRETCHING EXERCISES - Low Back Sprain Most people with lower back pain will find that their symptoms get worse with excessive bending forward (flexion) or arching at the lower back (extension). The exercises that will help resolve your symptoms will focus on the opposite motion.  Your physician, physical therapist or athletic trainer will help you determine which exercises will be most helpful to resolve your lower back pain. Do not complete any exercises without first consulting with your caregiver. Discontinue any exercises which make your symptoms worse, until you speak to your caregiver. If you have pain, numbness or tingling which travels down into your buttocks, leg or foot, the goal of the therapy is for these symptoms to  move closer to your back and eventually resolve. Sometimes, these leg symptoms will get better, but your lower back pain may worsen. This is often an indication of progress in your rehabilitation. Be very alert to any changes in your symptoms and the activities in which you participated in the 24 hours prior to the change. Sharing this information with your caregiver will allow him or her to most efficiently treat your condition. These exercises may help you when beginning to rehabilitate your injury. Your symptoms may resolve with or without further involvement from your physician, physical therapist or athletic trainer. While completing these exercises, remember:   Restoring tissue flexibility helps normal motion to return to the joints. This allows healthier, less painful movement and activity.  An effective stretch should be held for at least 30 seconds.  A stretch should never be painful. You should only feel a gentle lengthening or release in the stretched tissue. FLEXION RANGE OF MOTION AND STRETCHING EXERCISES: STRETCH - Flexion, Single Knee to Chest   Lie on a firm bed or floor with both legs extended in front of you.  Keeping one leg in contact with the floor, bring your opposite knee to your chest. Hold your leg in place by either grabbing behind your thigh or at your knee.  Pull until you feel a gentle stretch in your low back. Hold __________ seconds.  Slowly release your grasp and repeat  the exercise with the opposite side. Repeat __________ times. Complete this exercise __________ times per day.  STRETCH - Flexion, Double Knee to Chest  Lie on a firm bed or floor with both legs extended in front of you.  Keeping one leg in contact with the floor, bring your opposite knee to your chest.  Tense your stomach muscles to support your back and then lift your other knee to your chest. Hold your legs in place by either grabbing behind your thighs or at your knees.  Pull both knees  toward your chest until you feel a gentle stretch in your low back. Hold __________ seconds.  Tense your stomach muscles and slowly return one leg at a time to the floor. Repeat __________ times. Complete this exercise __________ times per day.  STRETCH - Low Trunk Rotation  Lie on a firm bed or floor. Keeping your legs in front of you, bend your knees so they are both pointed toward the ceiling and your feet are flat on the floor.  Extend your arms out to the side. This will stabilize your upper body by keeping your shoulders in contact with the floor.  Gently and slowly drop both knees together to one side until you feel a gentle stretch in your low back. Hold for __________ seconds.  Tense your stomach muscles to support your lower back as you bring your knees back to the starting position. Repeat the exercise to the other side. Repeat __________ times. Complete this exercise __________ times per day  EXTENSION RANGE OF MOTION AND FLEXIBILITY EXERCISES: STRETCH - Extension, Prone on Elbows   Lie on your stomach on the floor, a bed will be too soft. Place your palms about shoulder width apart and at the height of your head.  Place your elbows under your shoulders. If this is too painful, stack pillows under your chest.  Allow your body to relax so that your hips drop lower and make contact more completely with the floor.  Hold this position for __________ seconds.  Slowly return to lying flat on the floor. Repeat __________ times. Complete this exercise __________ times per day.  RANGE OF MOTION - Extension, Prone Press Ups  Lie on your stomach on the floor, a bed will be too soft. Place your palms about shoulder width apart and at the height of your head.  Keeping your back as relaxed as possible, slowly straighten your elbows while keeping your hips on the floor. You may adjust the placement of your hands to maximize your comfort. As you gain motion, your hands will come more  underneath your shoulders.  Hold this position __________ seconds.  Slowly return to lying flat on the floor. Repeat __________ times. Complete this exercise __________ times per day.  RANGE OF MOTION- Quadruped, Neutral Spine   Assume a hands and knees position on a firm surface. Keep your hands under your shoulders and your knees under your hips. You may place padding under your knees for comfort.  Drop your head and point your tailbone toward the ground below you. This will round out your lower back like an angry cat. Hold this position for __________ seconds.  Slowly lift your head and release your tail bone so that your back sags into a large arch, like an old horse.  Hold this position for __________ seconds.  Repeat this until you feel limber in your low back.  Now, find your "sweet spot." This will be the most comfortable position somewhere between the  two previous positions. This is your neutral spine. Once you have found this position, tense your stomach muscles to support your low back.  Hold this position for __________ seconds. Repeat __________ times. Complete this exercise __________ times per day.  STRENGTHENING EXERCISES - Low Back Sprain These exercises may help you when beginning to rehabilitate your injury. These exercises should be done near your "sweet spot." This is the neutral, low-back arch, somewhere between fully rounded and fully arched, that is your least painful position. When performed in this safe range of motion, these exercises can be used for people who have either a flexion or extension based injury. These exercises may resolve your symptoms with or without further involvement from your physician, physical therapist or athletic trainer. While completing these exercises, remember:   Muscles can gain both the endurance and the strength needed for everyday activities through controlled exercises.  Complete these exercises as instructed by your physician,  physical therapist or athletic trainer. Increase the resistance and repetitions only as guided.  You may experience muscle soreness or fatigue, but the pain or discomfort you are trying to eliminate should never worsen during these exercises. If this pain does worsen, stop and make certain you are following the directions exactly. If the pain is still present after adjustments, discontinue the exercise until you can discuss the trouble with your caregiver. STRENGTHENING - Deep Abdominals, Pelvic Tilt   Lie on a firm bed or floor. Keeping your legs in front of you, bend your knees so they are both pointed toward the ceiling and your feet are flat on the floor.  Tense your lower abdominal muscles to press your low back into the floor. This motion will rotate your pelvis so that your tail bone is scooping upwards rather than pointing at your feet or into the floor. With a gentle tension and even breathing, hold this position for __________ seconds. Repeat __________ times. Complete this exercise __________ times per day.  STRENGTHENING - Abdominals, Crunches   Lie on a firm bed or floor. Keeping your legs in front of you, bend your knees so they are both pointed toward the ceiling and your feet are flat on the floor. Cross your arms over your chest.  Slightly tip your chin down without bending your neck.  Tense your abdominals and slowly lift your trunk high enough to just clear your shoulder blades. Lifting higher can put excessive stress on the lower back and does not further strengthen your abdominal muscles.  Control your return to the starting position. Repeat __________ times. Complete this exercise __________ times per day.  STRENGTHENING - Quadruped, Opposite UE/LE Lift   Assume a hands and knees position on a firm surface. Keep your hands under your shoulders and your knees under your hips. You may place padding under your knees for comfort.  Find your neutral spine and gently tense  your abdominal muscles so that you can maintain this position. Your shoulders and hips should form a rectangle that is parallel with the floor and is not twisted.  Keeping your trunk steady, lift your right hand no higher than your shoulder and then your left leg no higher than your hip. Make sure you are not holding your breath. Hold this position for __________ seconds.  Continuing to keep your abdominal muscles tense and your back steady, slowly return to your starting position. Repeat with the opposite arm and leg. Repeat __________ times. Complete this exercise __________ times per day.  STRENGTHENING - Abdominals and  Quadriceps, Straight Leg Raise   Lie on a firm bed or floor with both legs extended in front of you.  Keeping one leg in contact with the floor, bend the other knee so that your foot can rest flat on the floor.  Find your neutral spine, and tense your abdominal muscles to maintain your spinal position throughout the exercise.  Slowly lift your straight leg off the floor about 6 inches for a count of 15, making sure to not hold your breath.  Still keeping your neutral spine, slowly lower your leg all the way to the floor. Repeat this exercise with each leg __________ times. Complete this exercise __________ times per day. POSTURE AND BODY MECHANICS CONSIDERATIONS - Low Back Sprain Keeping correct posture when sitting, standing or completing your activities will reduce the stress put on different body tissues, allowing injured tissues a chance to heal and limiting painful experiences. The following are general guidelines for improved posture. Your physician or physical therapist will provide you with any instructions specific to your needs. While reading these guidelines, remember:  The exercises prescribed by your provider will help you have the flexibility and strength to maintain correct postures.  The correct posture provides the best environment for your joints to work.  All of your joints have less wear and tear when properly supported by a spine with good posture. This means you will experience a healthier, less painful body.  Correct posture must be practiced with all of your activities, especially prolonged sitting and standing. Correct posture is as important when doing repetitive low-stress activities (typing) as it is when doing a single heavy-load activity (lifting). RESTING POSITIONS Consider which positions are most painful for you when choosing a resting position. If you have pain with flexion-based activities (sitting, bending, stooping, squatting), choose a position that allows you to rest in a less flexed posture. You would want to avoid curling into a fetal position on your side. If your pain worsens with extension-based activities (prolonged standing, working overhead), avoid resting in an extended position such as sleeping on your stomach. Most people will find more comfort when they rest with their spine in a more neutral position, neither too rounded nor too arched. Lying on a non-sagging bed on your side with a pillow between your knees, or on your back with a pillow under your knees will often provide some relief. Keep in mind, being in any one position for a prolonged period of time, no matter how correct your posture, can still lead to stiffness. PROPER SITTING POSTURE In order to minimize stress and discomfort on your spine, you must sit with correct posture. Sitting with good posture should be effortless for a healthy body. Returning to good posture is a gradual process. Many people can work toward this most comfortably by using various supports until they have the flexibility and strength to maintain this posture on their own. When sitting with proper posture, your ears will fall over your shoulders and your shoulders will fall over your hips. You should use the back of the chair to support your upper back. Your lower back will be in a neutral  position, just slightly arched. You may place a small pillow or folded towel at the base of your lower back for  support.  When working at a desk, create an environment that supports good, upright posture. Without extra support, muscles tire, which leads to excessive strain on joints and other tissues. Keep these recommendations in mind: CHAIR:  A  chair should be able to slide under your desk when your back makes contact with the back of the chair. This allows you to work closely.  The chair's height should allow your eyes to be level with the upper part of your monitor and your hands to be slightly lower than your elbows. BODY POSITION  Your feet should make contact with the floor. If this is not possible, use a foot rest.  Keep your ears over your shoulders. This will reduce stress on your neck and low back. INCORRECT SITTING POSTURES  If you are feeling tired and unable to assume a healthy sitting posture, do not slouch or slump. This puts excessive strain on your back tissues, causing more damage and pain. Healthier options include:  Using more support, like a lumbar pillow.  Switching tasks to something that requires you to be upright or walking.  Talking a brief walk.  Lying down to rest in a neutral-spine position. PROLONGED STANDING WHILE SLIGHTLY LEANING FORWARD  When completing a task that requires you to lean forward while standing in one place for a long time, place either foot up on a stationary 2-4 inch high object to help maintain the best posture. When both feet are on the ground, the lower back tends to lose its slight inward curve. If this curve flattens (or becomes too large), then the back and your other joints will experience too much stress, tire more quickly, and can cause pain. CORRECT STANDING POSTURES Proper standing posture should be assumed with all daily activities, even if they only take a few moments, like when brushing your teeth. As in sitting, your ears  should fall over your shoulders and your shoulders should fall over your hips. You should keep a slight tension in your abdominal muscles to brace your spine. Your tailbone should point down to the ground, not behind your body, resulting in an over-extended swayback posture.  INCORRECT STANDING POSTURES  Common incorrect standing postures include a forward head, locked knees and/or an excessive swayback. WALKING Walk with an upright posture. Your ears, shoulders and hips should all line-up. PROLONGED ACTIVITY IN A FLEXED POSITION When completing a task that requires you to bend forward at your waist or lean over a low surface, try to find a way to stabilize 3 out of 4 of your limbs. You can place a hand or elbow on your thigh or rest a knee on the surface you are reaching across. This will provide you more stability, so that your muscles do not tire as quickly. By keeping your knees relaxed, or slightly bent, you will also reduce stress across your lower back. CORRECT LIFTING TECHNIQUES DO :  Assume a wide stance. This will provide you more stability and the opportunity to get as close as possible to the object which you are lifting.  Tense your abdominals to brace your spine. Bend at the knees and hips. Keeping your back locked in a neutral-spine position, lift using your leg muscles. Lift with your legs, keeping your back straight.  Test the weight of unknown objects before attempting to lift them.  Try to keep your elbows locked down at your sides in order get the best strength from your shoulders when carrying an object.  Always ask for help when lifting heavy or awkward objects. INCORRECT LIFTING TECHNIQUES DO NOT:   Lock your knees when lifting, even if it is a small object.  Bend and twist. Pivot at your feet or move your feet when needing  to change directions.  Assume that you can safely pick up even a paperclip without proper posture.   This information is not intended to replace  advice given to you by your health care provider. Make sure you discuss any questions you have with your health care provider.   Document Released: 09/06/2005 Document Revised: 09/27/2014 Document Reviewed: 12/19/2008 Elsevier Interactive Patient Education [employer].

## 2016-07-30 NOTE — Assessment & Plan Note (Signed)
Chronic back pain no radiculopathy  Add celebrex Tramadol Home exercises/PT

## 2016-07-30 NOTE — Progress Notes (Signed)
Pt is here today to follow up on back pain. Pt states that the gabapentin is not helping with pain. Pt states that he back pain is worse since last visit.

## 2016-08-23 ENCOUNTER — Telehealth: Payer: Self-pay | Admitting: Family Medicine

## 2016-08-23 NOTE — Telephone Encounter (Signed)
Called to patient's pharmacy Received a fax asking of she could be temporarily have synthroid changed to lannett or mylan brand since synthroid is on temporary back order.  Gave authorization to temporarily change the brand.

## 2016-10-25 ENCOUNTER — Ambulatory Visit: Payer: Medicaid Other | Admitting: Adult Health

## 2016-10-26 ENCOUNTER — Telehealth: Payer: Self-pay | Admitting: Adult Health

## 2016-10-26 NOTE — Telephone Encounter (Signed)
Received forwarded message from desk nurse re patient wanting to cx 2/8 appointment and reschedule. Cancelled appointment and left message for patient re call office to reschedule.

## 2016-10-28 ENCOUNTER — Ambulatory Visit: Payer: Self-pay | Admitting: Adult Health

## 2016-11-01 ENCOUNTER — Telehealth: Payer: Self-pay | Admitting: Adult Health

## 2016-11-01 NOTE — Telephone Encounter (Signed)
Needs to reschedule an appointment for [facility] for  2/27 after 9:30 or after you may call and leave appointment on VM

## 2016-11-01 NOTE — Telephone Encounter (Signed)
lvm to inform ptof r/s appt to 2/27 at 1030 am per staff msg

## 2016-11-16 ENCOUNTER — Ambulatory Visit (HOSPITAL_BASED_OUTPATIENT_CLINIC_OR_DEPARTMENT_OTHER): Payer: Self-pay | Admitting: Adult Health

## 2016-11-16 ENCOUNTER — Telehealth: Payer: Self-pay | Admitting: Adult Health

## 2016-11-16 ENCOUNTER — Encounter: Payer: Self-pay | Admitting: Adult Health

## 2016-11-16 ENCOUNTER — Ambulatory Visit: Payer: Self-pay | Attending: Family Medicine

## 2016-11-16 ENCOUNTER — Ambulatory Visit (HOSPITAL_COMMUNITY)
Admission: RE | Admit: 2016-11-16 | Discharge: 2016-11-16 | Disposition: A | Discharge status: Home / Self Care | Payer: Self-pay | Source: Ambulatory Visit | Attending: Adult Health | Admitting: Adult Health

## 2016-11-16 VITALS — BP 110/64 | HR 88 | Temp 97.6°F | Resp 18 | Ht 65.0 in | Wt 182.9 lb

## 2016-11-16 DIAGNOSIS — C3491 Malignant neoplasm of unspecified part of right bronchus or lung: Secondary | ICD-10-CM | POA: Insufficient documentation

## 2016-11-16 DIAGNOSIS — Z85118 Personal history of other malignant neoplasm of bronchus and lung: Secondary | ICD-10-CM

## 2016-11-16 DIAGNOSIS — R053 Chronic cough: Secondary | ICD-10-CM

## 2016-11-16 DIAGNOSIS — R05 Cough: Secondary | ICD-10-CM

## 2016-11-16 DIAGNOSIS — J209 Acute bronchitis, unspecified: Secondary | ICD-10-CM

## 2016-11-16 MED ORDER — DOXYCYCLINE HYCLATE 100 MG PO TABS: 100.0000 mg | ORAL_TABLET | Freq: Two times a day (BID) | ORAL | 0 refills | Status: DC

## 2016-11-16 NOTE — Telephone Encounter (Signed)
Long Term Survivorship appointment scheduled for 1 year, per 11/16/16 los. Patient was given a copy of the AVS report and appointment schedule, per 11/16/16 los.

## 2016-11-16 NOTE — Progress Notes (Signed)
CLINIC:  Survivorship  REASON FOR VISIT:  Long-term survivorship surveillance visit for patient with history of lung cancer.   BRIEF ONCOLOGIC HISTORY:    Small cell lung cancer ([place])   01/1999 Miscellaneous    Presented with enlarged right supraclavicular lymph node.       01/27/1999 Imaging    CT chest (at [facility]): Massive adenopahy in thoracic inlet & anterior superior mediastinum. Massive retrocaval & middle mediastinal adenopathy along subcarinal lymphadenopathy & large right hilar mass with ?RUL post-obstructive pneumonitis.      01/30/1999 Initial Biopsy    Right supraclavicular biopsy: Small cell carcinoma.       01/1999 Initial Diagnosis    Small cell lung cancer ([place])      01/30/1999 Imaging    MRI Brain: Negative for metastatic disease. Bone window images show no acute bony abnormality.       04/02/1999 - 05/19/1999 Radiation Therapy    Right lung/mediastinum Valere Dross). Total dose: 58.6 Gy in 34 fractions over 47 days.  Treatment course complicated by admission to hospital during 5th week of radiaiton for severe esophageal spasm/odynophagia/esophagitis.       2000 -  Chemotherapy    Carboplatin/Etoposide ([place]); she received at least 2 doses of chemotherapy. However, specific doses, number of cycles, and specific days of therapy not available for review       07/27/1999 Imaging    Restaging CT c/a/neck: Some improvement in (R) hilar adenopathy with little change in pre- and subcarinal adenopathy. RLL & RML radiation pneumonitis. No evidence of mets in abd. Slight improvement in supraclavicular adenopathy.       10/08/1999 Imaging    Bone scan: Benign healing anterior rib fractures bilaterally.       11/04/1999 Imaging    MRI Brain: No evidence of pathological or intracranial enhancement.        12/07/1999 - 12/29/1999 Radiation Therapy    Prophylactic whole brain irradiation Valere Dross).  Total dose: 30.6 Gy in 17 fractions over 22 days.       12/03/2010 PET scan    RLL with possible radiation pneumonitis; difficult to determine if there is underlying neoplastic lesion until pneumonitis resolves; until there is resolution in pneumonitis, evauation of neoplasm by PET in same region is problematic.       07/04/2011 Imaging    CT chest: Right paramediastinal radiation changes. No evidence of recurrence/metastatic disease in the chest.       05/10/2012 Imaging    MRI Brain (done for sudden onset vertigo & weakness): No acute finding. Findings typical of chronic small vessel disease. Other foci of subcortical white matter signal & scattered areas of hemosiderin deposition may be r/t previous closed head injury      10/03/2012 Imaging    Chest x-ray (2 view): Stable right perihilar opacity related to prior radiation changes. No superimposed acute process.       09/20/2014 Imaging    CT Brain (for dizziness): Stable exam, no acute intracranial abnormality. Atrophy with chronic small vessel white matter ischemic demyelination.         INTERVAL HISTORY:  Stacy [name] is here for long term follow up of her h/o small cell lung cancer diagnosed in 2000.  She underwent treatment with radiation and chemotherapy.  She has had no sign of recurrence since then.  She is doing well today.  She continues to not have health insurance and declines CT chest.  She does have persistent cough x 3 weeks.  She is working at  a day care and thinks that she was exposed to an upper respiratory bug there.  She is doing well otherwise and is without questions or concerns.      ADDITIONAL REVIEW OF SYSTEMS:  Review of Systems  Constitutional: Negative for chills, fever, malaise/fatigue and weight loss.  HENT: Positive for congestion. Negative for ear pain, hearing loss, sore throat and tinnitus.   Eyes: Negative for blurred vision and double vision.  Respiratory: Positive for cough and sputum production. Negative for shortness of breath and wheezing.     Cardiovascular: Negative for chest pain, palpitations and leg swelling.  Gastrointestinal: Negative for heartburn, nausea and vomiting.  Musculoskeletal: Negative for joint pain and myalgias.  Skin: Negative for rash.  Neurological: Negative for dizziness, weakness and headaches.  Psychiatric/Behavioral: Negative for depression. The patient is not nervous/anxious.      PAST MEDICAL & SURGICAL HISTORY:  Past Medical History:  Diagnosis Date  . Acquired hypothyroidism 04/24/2012  . Anxiety   . Depression with anxiety 04/24/2012  . H/O chronic ulcerative colitis 04/24/2012   Mild   . 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 Small cell lung cancer ([place]) 04/24/2012   Presented 01/1999 w right supraclavicular lymph node enlargement. Rx Carboplatinum/etoposide X 6, Topotecan X 2, Radiation  . Vertigo    Past Surgical History:  Procedure Laterality Date  . ABDOMINAL HYSTERECTOMY    . laproscopy      SOCIAL HISTORY:  Faiga lives with her son who is enrolled in college in [place], [place].  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  She denies tobacco, ETOH, or illicit drug use.    CURRENT MEDICATIONS:  Current Outpatient Prescriptions on File Prior to Visit  Medication Sig Dispense Refill  . Calcium Citrate 200 MG TABS Take 2 tablets (400 mg total) by mouth daily. 60 tablet 11  . FLUoxetine (PROZAC) 20 MG capsule Take 1 capsule (20 mg total) by mouth daily. Reported on 03/30/2016 30 capsule 11  . levothyroxine (SYNTHROID, LEVOTHROID) 50 MCG tablet Take 1 tablet (50 mcg total) by mouth daily before breakfast. 30 tablet 11  . Multiple Vitamin (MULTIVITAMIN WITH MINERALS) TABS Take 1 tablet by mouth daily.    . Omega-3 Fatty Acids (FISH OIL) 1000 MG CAPS Take 1 capsule by mouth daily.     Marland Kitchen aspirin EC 81 MG tablet Take 81 mg by mouth daily.     Marland Kitchen dimenhyDRINATE  (DRAMAMINE) 50 MG tablet Take 50 mg by mouth every 8 (eight) hours as needed for dizziness. Reported on 03/30/2016    . traMADol (ULTRAM) 50 MG tablet Take 1 tablet (50 mg total) by mouth at bedtime. (Patient not taking: Reported on 11/16/2016) 60 tablet 0   No current facility-administered medications on file prior to visit.     ALLERGIES:  No Known Allergies  PHYSICAL EXAM:  Vitals:   11/16/16 1319  BP: 110/64  Pulse: 88  Resp: 18  Temp: 97.6 F (36.4 C)   Filed Weights   11/16/16 1319  Weight: 182 lb 14.4 oz (83 kg)    General: Well-nourished, well-appearing female in no acute distress.  Unaccompanied today.  HEENT: Head is atraumatic and normocephalic.  Pupils equal and reactive to light. Conjunctivae clear without exudate.  Sclerae anicteric. Oral mucosa is pink and moist without lesions. Oropharynx is pink and moist, without lesions. Lymph: No cervical, supraclavicular, or  supraclavicular lymphadenopathy noted on palpation.   Cardiovascular: Normal rate and rhythm. Respiratory: Clear to auscultation bilaterally. Chest expansion symmetric without accessory muscle use on inspiration or expiration.   GI: Abdomen soft and round. No tenderness to palpation. Bowel sounds normoactive in 4 quadrants. No hepatosplenomegaly.  GU: Deferred.   Neuro: No focal deficits. Steady gait.   Psych: Normal mood and affect for situation. Extremities: No edema, cyanosis, or clubbing.   Skin: Warm and dry.    LABORATORY DATA:  None at this visit.  DIAGNOSTIC IMAGING:     ASSESSMENT & PLAN:  Ms. Mitchum is a pleasant 61 y.o. female with history of SCLC, treated with radiation and chemotherapy; completed treatment in 2000.  Patient presents to survivorship clinic today for routine surveillance as a long-term cancer survivor.   1. History of lung cancer: Stacy [name] is doing well today.  She has no clinical or radiographic sign of lung cancer recurrence.  She does not have insurance and therefore  she cannot afford a CT chest, however chest xrays have been normal.  She will call me once she has insurance and I will order CT chest.    2. Acute bacterial Bronchitis: Doxycycline '100mg'$  PO bid x 10 days.  Will repeat chest xray.    3. Health maintenance: I gave patient information on the breast and cervical cancer screening program.  She is enrolled in this and on the calendar for her cancer screenings.  Once she enrolls in insurance she tells me she will catch up her colonoscopy if due.  She has had one previously but cannot recall the date.    Dispo: --LTS visit in 1 year --CT chest as soon as she gets insurance this year   A total of (30) minutes of face-to-face time was spent with this patient with greater than 50% of that time in counseling and care-coordination.   Charlestine Massed, NP [place] [phone]

## 2017-02-02 ENCOUNTER — Encounter: Payer: Self-pay | Admitting: Family Medicine

## 2017-04-18 ENCOUNTER — Ambulatory Visit: Payer: Self-pay | Admitting: Family Medicine

## 2017-06-27 ENCOUNTER — Ambulatory Visit: Payer: Self-pay

## 2017-11-14 ENCOUNTER — Telehealth: Payer: Self-pay | Admitting: Adult Health

## 2017-11-14 NOTE — Telephone Encounter (Signed)
Patient called needing to reschedule her 2/28 appt due to work issues.  Appointment moved per her request.

## 2017-11-17 ENCOUNTER — Encounter: Payer: Self-pay | Admitting: Adult Health

## 2017-11-21 ENCOUNTER — Telehealth: Payer: Self-pay

## 2017-11-21 ENCOUNTER — Telehealth: Payer: Self-pay | Admitting: Adult Health

## 2017-11-21 ENCOUNTER — Encounter: Payer: Self-pay | Admitting: Adult Health

## 2017-11-21 ENCOUNTER — Inpatient Hospital Stay: Payer: Self-pay | Attending: Adult Health | Admitting: Adult Health

## 2017-11-21 ENCOUNTER — Encounter: Payer: Self-pay | Admitting: *Deleted

## 2017-11-21 VITALS — BP 117/76 | HR 94 | Temp 98.2°F | Resp 18 | Ht 65.0 in | Wt 188.8 lb

## 2017-11-21 DIAGNOSIS — Z85118 Personal history of other malignant neoplasm of bronchus and lung: Secondary | ICD-10-CM | POA: Insufficient documentation

## 2017-11-21 DIAGNOSIS — Z9221 Personal history of antineoplastic chemotherapy: Secondary | ICD-10-CM | POA: Insufficient documentation

## 2017-11-21 DIAGNOSIS — Z87891 Personal history of nicotine dependence: Secondary | ICD-10-CM | POA: Insufficient documentation

## 2017-11-21 DIAGNOSIS — G8929 Other chronic pain: Secondary | ICD-10-CM | POA: Insufficient documentation

## 2017-11-21 DIAGNOSIS — M549 Dorsalgia, unspecified: Secondary | ICD-10-CM | POA: Insufficient documentation

## 2017-11-21 DIAGNOSIS — Z923 Personal history of irradiation: Secondary | ICD-10-CM | POA: Insufficient documentation

## 2017-11-21 DIAGNOSIS — E039 Hypothyroidism, unspecified: Secondary | ICD-10-CM | POA: Insufficient documentation

## 2017-11-21 DIAGNOSIS — Z7982 Long term (current) use of aspirin: Secondary | ICD-10-CM | POA: Insufficient documentation

## 2017-11-21 DIAGNOSIS — Z79899 Other long term (current) drug therapy: Secondary | ICD-10-CM | POA: Insufficient documentation

## 2017-11-21 DIAGNOSIS — C3490 Malignant neoplasm of unspecified part of unspecified bronchus or lung: Secondary | ICD-10-CM

## 2017-11-21 NOTE — Progress Notes (Signed)
[place] Psychosocial Distress Screening Clinical Social Work  Clinical Social Work was referred by APP and distress screening protocol.  The patient scored a 8 on the Psychosocial Distress Thermometer which indicates moderate distress. Clinical Social Worker met with patient in exam room at [facility] to assess for distress and other psychosocial needs.  Patient expressed financial and insurance concerns.  Patient is currently working part-time, but stated she cannot afford the insurance offered through her employer or the marketplace.  Patient stated she has completed the process for the orange card through [place] [facility] in the past, but it has expired.  CSW provided patient with contact information for [facility]; both provide primary care to uninsured patients.  CSW encouraged patient to update her orange card through [facility], and to explore other possible resources the group offers .  CSW also encouraged patient to take advantage of the free health screening offered through community outreach.  Patient plans to contact community outreach to get additional information on upcoming screening.  CSW provided contact information and encouraged patient to call with additional questions or concerns.             ONCBCN DISTRESS SCREENING 11/21/2017  Screening Type Initial Screening  Distress experienced in past week (1-10) 8  Practical problem type Insurance;Work/school  Family Problem type (No Data)  Emotional problem type Nervousness/Anxiety  Spiritual/Religous concerns type (No Data)  Information Concerns Type (No Data)  Physical Problem type Pain    Johnnye Lana, MSW, LCSW, OSW-C Clinical Social Worker [place] [phone]

## 2017-11-21 NOTE — Telephone Encounter (Signed)
Gave avs and calendar for march 2020

## 2017-11-21 NOTE — Progress Notes (Signed)
CLINIC:  Survivorship  REASON FOR VISIT:  Long-term survivorship surveillance visit for patient with history of lung cancer.   BRIEF ONCOLOGIC HISTORY:    Small cell lung cancer ([place])   01/1999 Miscellaneous    Presented with enlarged right supraclavicular lymph node.       01/27/1999 Imaging    CT chest (at [facility]): Massive adenopahy in thoracic inlet & anterior superior mediastinum. Massive retrocaval & middle mediastinal adenopathy along subcarinal lymphadenopathy & large right hilar mass with ?RUL post-obstructive pneumonitis.      01/30/1999 Initial Biopsy    Right supraclavicular biopsy: Small cell carcinoma.       01/1999 Initial Diagnosis    Small cell lung cancer ([place])      01/30/1999 Imaging    MRI Brain: Negative for metastatic disease. Bone window images show no acute bony abnormality.       04/02/1999 - 05/19/1999 Radiation Therapy    Right lung/mediastinum Valere Dross). Total dose: 58.6 Gy in 34 fractions over 47 days.  Treatment course complicated by admission to hospital during 5th week of radiaiton for severe esophageal spasm/odynophagia/esophagitis.       2000 -  Chemotherapy    Carboplatin/Etoposide ([place]); she received at least 2 doses of chemotherapy. However, specific doses, number of cycles, and specific days of therapy not available for review       07/27/1999 Imaging    Restaging CT c/a/neck: Some improvement in (R) hilar adenopathy with little change in pre- and subcarinal adenopathy. RLL & RML radiation pneumonitis. No evidence of mets in abd. Slight improvement in supraclavicular adenopathy.       10/08/1999 Imaging    Bone scan: Benign healing anterior rib fractures bilaterally.       11/04/1999 Imaging    MRI Brain: No evidence of pathological or intracranial enhancement.        12/07/1999 - 12/29/1999 Radiation Therapy    Prophylactic whole brain irradiation Valere Dross).  Total dose: 30.6 Gy in 17 fractions over 22 days.       12/03/2010 PET scan    RLL with possible radiation pneumonitis; difficult to determine if there is underlying neoplastic lesion until pneumonitis resolves; until there is resolution in pneumonitis, evauation of neoplasm by PET in same region is problematic.       07/04/2011 Imaging    CT chest: Right paramediastinal radiation changes. No evidence of recurrence/metastatic disease in the chest.       05/10/2012 Imaging    MRI Brain (done for sudden onset vertigo & weakness): No acute finding. Findings typical of chronic small vessel disease. Other foci of subcortical white matter signal & scattered areas of hemosiderin deposition may be r/t previous closed head injury      10/03/2012 Imaging    Chest x-ray (2 view): Stable right perihilar opacity related to prior radiation changes. No superimposed acute process.       09/20/2014 Imaging    CT Brain (for dizziness): Stable exam, no acute intracranial abnormality. Atrophy with chronic small vessel white matter ischemic demyelination.         INTERVAL HISTORY:   Stacy [name] is doing well today.  She has no issues such as pain, fevers, chills, cough, shortness of breath, or any other concerns today.  She is distressed about her lack of insurance. She has chronic back pain, and wishes she could get back in with pain management as they have helped her with this issue previously.  She says that the pain is not worse than  it previously was and she has no new associated symptoms.  She has seen health and wellness clinic, but reports difficulty with being able to provide all of the required documentation for those visits.  She is also requesting to be reconnected with [facility].     ADDITIONAL REVIEW OF SYSTEMS:  Review of Systems  Constitutional: Negative for chills, diaphoresis, fever and malaise/fatigue.  HENT: Negative for hearing loss and tinnitus.   Eyes: Negative for blurred vision and double vision.  Respiratory: Negative for cough and shortness of  breath.   Cardiovascular: Negative for chest pain, palpitations and leg swelling.  Gastrointestinal: Negative for heartburn.  Skin: Negative for itching and rash.  Neurological: Negative for dizziness and headaches.  Endo/Heme/Allergies: Does not bruise/bleed easily.  Psychiatric/Behavioral: Negative for depression.     PAST MEDICAL & SURGICAL HISTORY:  Past Medical History:  Diagnosis Date  . Acquired hypothyroidism 04/24/2012  . Anxiety   . Depression with anxiety 04/24/2012  . H/O chronic ulcerative colitis 04/24/2012   Mild   . 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 Small cell lung cancer ([place]) 04/24/2012   Presented 01/1999 w right supraclavicular lymph node enlargement. Rx Carboplatinum/etoposide X 6, Topotecan X 2, Radiation  . Vertigo    Past Surgical History:  Procedure Laterality Date  . ABDOMINAL HYSTERECTOMY    . laproscopy      SOCIAL HISTORY:  Social History   Socioeconomic History  . Marital status: Divorced    Spouse name: Not on file  . Number of children: 2  . Years of education: BA  . Highest education level: Not on file  Social Needs  . Financial resource strain: Not on file  . Food insecurity - worry: Not on file  . Food insecurity - inability: Not on file  . Transportation needs - medical: Not on file  . Transportation needs - non-medical: Not on file  Occupational History  . Occupation: [employer]  Tobacco Use  . Smoking status: Former Smoker    Last attempt to quit: 09/18/1999    Years since quitting: 18.1  . Smokeless tobacco: Never Used  Substance and Sexual Activity  . Alcohol use: Yes    Alcohol/week: 0.0 oz    Comment: Ocasional   . Drug use: No  . Sexual activity: No  Other Topics Concern  . Not on file  Social History Narrative   Drinks 2 cups of tea a day       CURRENT MEDICATIONS:  Current Outpatient Medications on File Prior to Visit  Medication  Sig Dispense Refill  . aspirin-acetaminophen-caffeine (EXCEDRIN MIGRAINE) 250-250-65 MG tablet Take 2 tablets by mouth every 6 (six) hours as needed (FOR BACKACHE).    Marland Kitchen lactobacillus acidophilus (BACID) TABS tablet Take 2 tablets by mouth 3 (three) times daily.    . Multiple Vitamin (MULTIVITAMIN WITH MINERALS) TABS Take 1 tablet by mouth daily.    . Omega-3 Fatty Acids (FISH OIL) 1000 MG CAPS Take 1 capsule by mouth daily.     Marland Kitchen OVER THE COUNTER MEDICATION VITAMIN D DAILY    . aspirin EC 81 MG tablet Take 81 mg by mouth daily.      No current facility-administered medications on file prior to visit.     ALLERGIES:  No Known Allergies  PHYSICAL EXAM:  Vitals:   11/21/17 1319  BP: 117/76  Pulse: 94  Resp: 18  Temp: 98.2 F (36.8 C)  SpO2: 97%   Filed Weights   11/21/17 1319  Weight: 188 lb 12.8 oz (85.6 kg)    General: Well-nourished, well-appearing female in no acute distress.  Accompanied/Unaccompanied today.  HEENT: Head is atraumatic and normocephalic.  Pupils equal and reactive to light. Conjunctivae clear without exudate.  Sclerae anicteric. Oral mucosa is pink and moist without lesions. Oropharynx is pink and moist, without lesions. Lymph: No cervical, supraclavicular, or supraclavicular lymphadenopathy noted on palpation.   Cardiovascular: Normal rate and rhythm. Respiratory: Clear to auscultation bilaterally. Chest expansion symmetric without accessory muscle use on inspiration or expiration.   Breasts: bilateral breasts without nodules, masses, skin or nipple changes, benign breast exam. GI: Abdomen soft and round. No tenderness to palpation. Bowel sounds normoactive in 4 quadrants. No hepatosplenomegaly.  GU: Deferred.   Neuro: No focal deficits. Steady gait.   Psych: Normal mood and affect for situation. Extremities: No edema, cyanosis, or clubbing.   Skin: Warm and dry.    LABORATORY DATA:  None at this visit.  DIAGNOSTIC IMAGING:  None at this visit.     ASSESSMENT & PLAN:  Stacy [name] is a pleasant 62 y.o. female with history of SCLC, treated with radiation and chemotherapy; completed treatment in 2000.  Patient presents to survivorship clinic today for routine surveillance as a long-term cancer survivor.  1. History of lung cancer: Stacy [name] is doing well and has no clinical sign of recurrence.  She needs a low dose chest CT, however she cannot afford this and declines due to cost.  I have sent a message to our social workers and Information systems manager in [employer] to see if there can be some sort of assistance.  2. Chronic back pain: She was previously under the care of pain management until her insurance got interrupted.  I recommended she f/u with a PCP that she is able to attain after the assistance of social work.  Spine xrays declined today.    3. Health maintenance: She needs all of her cancer screening.  I have sent a message to [facility] for some of this.  Hopefully social work can help also.     Dispo: -LTS f/u in one year -Mammo/cervical cancer screening with [facility] -f/u with PCP   A total of (30) minutes of face-to-face time was spent with this patient with greater than 50% of that time in counseling and care-coordination.   Gardenia Phlegm, [place] [phone]

## 2017-11-21 NOTE — Telephone Encounter (Signed)
During rooming intake - pt reports financial issues, not able to get many hours at work and is trying to find another job, no insurance and would like to know more about getting assistance with visit with PCP and/or medication assistance.  Pt would like her cholestrol and thyroid checked, used to be on Synthroid but prescription "ran out".  Notified Wilber Bihari NP and Bing Neighbors in Social work and Insurance risk surveyor came to see pt during her survivorship visit today. Will also inbasket Christina Brannock and [place] in the [place] (breast & cervical program) because pt inquiring about assistance getting mammogram also.

## 2018-03-09 ENCOUNTER — Other Ambulatory Visit: Payer: Self-pay | Admitting: Obstetrics and Gynecology

## 2018-03-09 DIAGNOSIS — Z1231 Encounter for screening mammogram for malignant neoplasm of breast: Secondary | ICD-10-CM

## 2018-04-06 ENCOUNTER — Ambulatory Visit (HOSPITAL_COMMUNITY): Payer: Self-pay

## 2018-11-09 ENCOUNTER — Encounter: Payer: Self-pay | Admitting: Critical Care Medicine

## 2018-11-09 ENCOUNTER — Ambulatory Visit: Payer: Self-pay | Attending: Critical Care Medicine | Admitting: Critical Care Medicine

## 2018-11-09 VITALS — BP 112/78 | HR 87 | Temp 98.3°F | Resp 16 | Ht 65.0 in | Wt 191.0 lb

## 2018-11-09 DIAGNOSIS — M545 Low back pain, unspecified: Secondary | ICD-10-CM

## 2018-11-09 DIAGNOSIS — G8929 Other chronic pain: Secondary | ICD-10-CM

## 2018-11-09 DIAGNOSIS — Z13220 Encounter for screening for lipoid disorders: Secondary | ICD-10-CM

## 2018-11-09 DIAGNOSIS — C3490 Malignant neoplasm of unspecified part of unspecified bronchus or lung: Secondary | ICD-10-CM

## 2018-11-09 DIAGNOSIS — Z85118 Personal history of other malignant neoplasm of bronchus and lung: Secondary | ICD-10-CM

## 2018-11-09 DIAGNOSIS — E039 Hypothyroidism, unspecified: Secondary | ICD-10-CM

## 2018-11-09 DIAGNOSIS — K219 Gastro-esophageal reflux disease without esophagitis: Secondary | ICD-10-CM

## 2018-11-09 DIAGNOSIS — Z1211 Encounter for screening for malignant neoplasm of colon: Secondary | ICD-10-CM

## 2018-11-09 MED ORDER — METHOCARBAMOL 500 MG PO TABS: 500.0000 mg | ORAL_TABLET | Freq: Three times a day (TID) | ORAL | 2 refills | Status: DC | PRN

## 2018-11-09 MED ORDER — FAMOTIDINE 20 MG PO TABS: 20.0000 mg | ORAL_TABLET | Freq: Every day | ORAL | 6 refills | Status: DC

## 2018-11-09 MED ORDER — IBUPROFEN 600 MG PO TABS: 600.0000 mg | ORAL_TABLET | Freq: Three times a day (TID) | ORAL | 0 refills | Status: DC | PRN

## 2018-11-09 MED FILL — IBUPROFEN 600 MG TABLET: 600 | 10 days supply | Qty: 30 | Fill #0

## 2018-11-09 MED FILL — METHOCARBAMOL 500 MG TABS: 500 | 30 days supply | Qty: 90 | Fill #0

## 2018-11-09 NOTE — Assessment & Plan Note (Signed)
Chronic low back pain  We will discontinue Excedrin and begin ibuprofen 600 mg every 8 hours as needed and Robaxin 5 mg every 8 hours as needed for muscle spasms

## 2018-11-09 NOTE — Assessment & Plan Note (Signed)
Question of hypothyroidism due to previous mantle irradiation for lung cancer  We will obtain thyroid function study and determine whether supplementation is necessary

## 2018-11-09 NOTE — Assessment & Plan Note (Signed)
History of small cell lung cancer now in remission status post chemo and radiation  We will obtain a chest x-ray

## 2018-11-09 NOTE — Assessment & Plan Note (Signed)
Symptomatic reflux disease  I issued the patient a reflux diet and prescribed Pepcid 20 mg at bedtime

## 2018-11-09 NOTE — Progress Notes (Signed)
Subjective:    Patient ID: Stacy [name], female    DOB: 03/25/1956, 63 y.o.   MRN: [id number]  62 y.o.F hx of Small Cell Ca of lung in remission.  Prior patient at this clinic, not seen since 07/2016 Patient had previously been in this clinic in 2017 but has not been back during this time.  The patient is 20 years out from small cell lung cancer now in remission.  Patient also had a fracture in the lower spine in 2003 and wore a brace.  Patient still has chronic back pain and muscle spasms.  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  Also history of hypothyroidism and she stopped taking thyroid medicines many years ago.  The patient does follow-up with oncology annually however is not had a chest x-ray in some time.  The patient still does not have insurance and is interested in the orange card.  [facility] her local [facility] referred her to our clinic to establish care   Past Medical History:  Diagnosis Date  . Acquired hypothyroidism 04/24/2012  . Anxiety   . Depression with anxiety 04/24/2012  . H/O chronic ulcerative colitis 04/24/2012   Mild   . 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 Small cell lung cancer ([place]) 04/24/2012   Presented 01/1999 w right supraclavicular lymph node enlargement. Rx Carboplatinum/etoposide X 6, Topotecan X 2, Radiation  . Vertigo      Family History  Problem Relation Age of Onset  . Hypertension Mother   . High Cholesterol Mother   . Prostate cancer Father      Social History   Socioeconomic History  . Marital status: Divorced    Spouse name: Not on file  . Number of children: 2  . Years of education: BA  . Highest education level: Not on file  Occupational History  . Occupation: [employer]  Social Needs  . Financial resource strain: Not on file  . Food insecurity:    Worry: Not on file    Inability: Not on file  . Transportation needs:     Medical: Not on file    Non-medical: Not on file  Tobacco Use  . Smoking status: Former Smoker    Last attempt to quit: 09/18/1999    Years since quitting: 19.1  . Smokeless tobacco: Never Used  Substance and Sexual Activity  . Alcohol use: Yes    Alcohol/week: 0.0 standard drinks    Comment: Ocasional   . Drug use: No  . Sexual activity: Never  Lifestyle  . Physical activity:    Days per week: Not on file    Minutes per session: Not on file  . Stress: Not on file  Relationships  . Social connections:    Talks on phone: Not on file    Gets together: Not on file    Attends religious service: Not on file    Active member of club or organization: Not on file    Attends meetings of clubs or organizations: Not on file    Relationship status: Not on file  . Intimate partner violence:    Fear of current or ex partner: Not on file    Emotionally abused: Not on file    Physically abused: Not on file    Forced sexual activity: Not on file  Other Topics Concern  . Not on file  Social History Narrative  Drinks 2 cups of tea a day      No Known Allergies   Outpatient Medications Prior to Visit  Medication Sig Dispense Refill  . aspirin EC 81 MG tablet Take 81 mg by mouth daily.     Marland Kitchen lactobacillus acidophilus (BACID) TABS tablet Take 2 tablets by mouth 3 (three) times daily.    . Multiple Vitamin (MULTIVITAMIN WITH MINERALS) TABS Take 1 tablet by mouth daily.    . Omega-3 Fatty Acids (FISH OIL) 1000 MG CAPS Take 1 capsule by mouth daily.     Marland Kitchen OVER THE COUNTER MEDICATION VITAMIN D DAILY    . aspirin-acetaminophen-caffeine (EXCEDRIN MIGRAINE) 250-250-65 MG tablet Take 2 tablets by mouth every 6 (six) hours as needed (FOR BACKACHE).     No facility-administered medications prior to visit.       Review of Systems  Constitutional: Negative.   HENT: Negative.   Eyes: Negative.        Visual migraines  Respiratory: Negative for cough, choking, chest tightness, shortness of  breath, wheezing and stridor.   Cardiovascular: Negative for chest pain, palpitations and leg swelling.  Gastrointestinal: Negative for blood in stool, nausea and vomiting.       Gerd  Endocrine: Positive for heat intolerance. Negative for cold intolerance.  Genitourinary: Negative.   Musculoskeletal: Positive for arthralgias, back pain and myalgias. Negative for gait problem, joint swelling, neck pain and neck stiffness.  Allergic/Immunologic: Negative.   Neurological: Positive for weakness, light-headedness and headaches. Negative for dizziness, tremors, seizures, syncope and numbness.  Hematological: Negative.   Psychiatric/Behavioral: Positive for dysphoric mood. Negative for confusion. The patient is nervous/anxious.        Objective:   Physical Exam Vitals:   11/09/18 1347  BP: 112/78  Pulse: 87  Resp: 16  Temp: 98.3 F (36.8 C)  TempSrc: Oral  SpO2: 97%  Weight: 191 lb (86.6 kg)  Height: 5\' 5"  (1.651 m)    Gen: Pleasant, well-nourished, in no distress,  normal affect  ENT: No lesions,  mouth clear,  oropharynx clear, no postnasal drip  Neck: No JVD, no TMG, no carotid bruits  Lungs: No use of accessory muscles, no dullness to percussion, distant breath sounds  Cardiovascular: RRR, heart sounds normal, no murmur or gallops, no peripheral edema  Abdomen: soft and NT, no HSM,  BS normal  Musculoskeletal: No deformities, no cyanosis or clubbing  Neuro: alert, non focal  Skin: Warm, no lesions or rashes  No results found.  No recent labs      Assessment & Plan:  I personally reviewed all images and lab data in the [facility] system as well as any outside material available during this office visit and agree with the  radiology impressions.   Small cell lung cancer ([place]) History of small cell lung cancer now in remission status post chemo and radiation  We will obtain a chest x-ray  Gastroesophageal reflux disease without esophagitis Symptomatic reflux  disease  I issued the patient a reflux diet and prescribed Pepcid 20 mg at bedtime  Acquired hypothyroidism Question of hypothyroidism due to previous mantle irradiation for lung cancer  We will obtain thyroid function study and determine whether supplementation is necessary  Chronic low back pain Chronic low back pain  We will discontinue Excedrin and begin ibuprofen 600 mg every 8 hours as needed and Robaxin 5 mg every 8 hours as needed for muscle spasms   We will also obtain a complete metabolic panel, CBC, lipid panel along with  thyroid panel  We will obtain fecal card for lung cancer screening and administer a flu vaccine and tetanus vaccine

## 2018-11-09 NOTE — Patient Instructions (Addendum)
A muscle relaxant was sent to the pharmacy along with ibuprofen for back pain  Labs today will include a metabolic panel, blood count, thyroid and lipid panel  We will ask you to collect a stool sample for the stool card and send back in for colon cancer screening  Pepcid 20 mg at bedtime was given for your acid reflux and follow a acid reflux diet as below  A tetanus vaccine and flu vaccine was administered today  A chest x-ray was also ordered  We will have you return to establish for primary care with a Pap smear at your next visit within 2 months   Food Choices for Gastroesophageal Reflux Disease, Adult When you have gastroesophageal reflux disease (GERD), the foods you eat and your eating habits are very important. Choosing the right foods can help ease your discomfort. Think about working with a nutrition specialist (dietitian) to help you make good choices. What are tips for following this plan?  Meals  Choose healthy foods that are low in fat, such as fruits, vegetables, whole grains, low-fat dairy products, and lean meat, fish, and poultry.  Eat small meals often instead of 3 large meals a day. Eat your meals slowly, and in a place where you are relaxed. Avoid bending over or lying down until 2-3 hours after eating.  Avoid eating meals 2-3 hours before bed.  Avoid drinking a lot of liquid with meals.  Cook foods using methods other than frying. Bake, grill, or broil food instead.  Avoid or limit: ? Chocolate. ? Peppermint or spearmint. ? Alcohol. ? Pepper. ? Black and decaffeinated coffee. ? Black and decaffeinated tea. ? Bubbly (carbonated) soft drinks. ? Caffeinated energy drinks and soft drinks.  Limit high-fat foods such as: ? Fatty meat or fried foods. ? Whole milk, cream, butter, or ice cream. ? Nuts and nut butters. ? Pastries, donuts, and sweets made with butter or shortening.  Avoid foods that cause symptoms. These foods may be different for everyone.  Common foods that cause symptoms include: ? Tomatoes. ? Oranges, lemons, and limes. ? Peppers. ? Spicy food. ? Onions and garlic. ? Vinegar. Lifestyle  Maintain a healthy weight. Ask your doctor what weight is healthy for you. If you need to lose weight, work with your doctor to do so safely.  Exercise for at least 30 minutes for 5 or more days each week, or as told by your doctor.  Wear loose-fitting clothes.  Do not smoke. If you need help quitting, ask your doctor.  Sleep with the head of your bed higher than your feet. Use a wedge under the mattress or blocks under the bed frame to raise the head of the bed. Summary  When you have gastroesophageal reflux disease (GERD), food and lifestyle choices are very important in easing your symptoms.  Eat small meals often instead of 3 large meals a day. Eat your meals slowly, and in a place where you are relaxed.  Limit high-fat foods such as fatty meat or fried foods.  Avoid bending over or lying down until 2-3 hours after eating.  Avoid peppermint and spearmint, caffeine, alcohol, and chocolate. This information is not intended to replace advice given to you by your health care provider. Make sure you discuss any questions you have with your health care provider. Document Released: 03/07/2012 Document Revised: 10/12/2016 Document Reviewed: 10/12/2016 Elsevier Interactive Patient Education  2019 Elsevier Inc.    Td Vaccine (Tetanus and Diphtheria): What You Need to Know 1. Why  get vaccinated? Tetanus  and diphtheria are very serious diseases. They are rare in the [place] today, but people who do become infected often have severe complications. Td vaccine is used to protect adolescents and adults from both of these diseases. Both tetanus and diphtheria are infections caused by bacteria. Diphtheria spreads from person to person through coughing or sneezing. Tetanus-causing bacteria enter the body through cuts, scratches, or  wounds. TETANUS (Lockjaw) causes painful muscle tightening and stiffness, usually all over the body.  It can lead to tightening of muscles in the head and neck so you can't open your mouth, swallow, or sometimes even breathe. Tetanus kills about 1 out of every 10 people who are infected even after receiving the best medical care. DIPHTHERIA can cause a thick coating to form in the back of the throat.  It can lead to breathing problems, paralysis, heart failure, and death. Before vaccines, as many as 200,000 cases of diphtheria and hundreds of cases of tetanus were reported in the [place] each year. Since vaccination began, reports of cases for both diseases have dropped by about 99%. 2. Td vaccine Td vaccine can protect adolescents and adults from tetanus and diphtheria. Td is usually given as a booster dose every 10 years but it can also be given earlier after a severe and dirty wound or burn. Another vaccine, called Tdap, which protects against pertussis in addition to tetanus and diphtheria, is sometimes recommended instead of Td vaccine. Your doctor or the person giving you the vaccine can give you more information. Td may safely be given at the same time as other vaccines. 3. Some people should not get this vaccine  A person who has ever had a life-threatening allergic reaction after a previous dose of any tetanus or diphtheria containing vaccine, OR has a severe allergy to any part of this vaccine, should not get Td vaccine. Tell the person giving the vaccine about any severe allergies.  Talk to your doctor if you: ? had severe pain or swelling after any vaccine containing diphtheria or tetanus, ? ever had a condition called Guillain Barr Syndrome (GBS), ? aren't feeling well on the day the shot is scheduled. 4. Risks of a vaccine reaction With any medicine, including vaccines, there is a chance of side effects. These are usually mild and go away on their own. Serious reactions are  also possible but are rare. Most people who get Td vaccine do not have any problems with it. Mild Problems following Td vaccine: (Did not interfere with activities)  Pain where the shot was given (about 8 people in 10)  Redness or swelling where the shot was given (about 1 person in 4)  Mild fever (rare)  Headache (about 1 person in 4)  Tiredness (about 1 person in 4) Moderate Problems following Td vaccine: (Interfered with activities, but did not require medical attention)  Fever over 102F (rare) Severe Problems following Td vaccine: (Unable to perform usual activities; required medical attention)  Swelling, severe pain, bleeding and/or redness in the arm where the shot was given (rare). Problems that could happen after any vaccine:  People sometimes faint after a medical procedure, including vaccination. Sitting or lying down for about 15 minutes can help prevent fainting, and injuries caused by a fall. Tell your doctor if you feel dizzy, or have vision changes or ringing in the ears.  Some people get severe pain in the shoulder and have difficulty moving the arm where a shot was given. This happens  very rarely.  Any medication can cause a severe allergic reaction. Such reactions from a vaccine are very rare, estimated at fewer than 1 in a million doses, and would happen within a few minutes to a few hours after the vaccination. As with any medicine, there is a very remote chance of a vaccine causing a serious injury or death. The safety of vaccines is always being monitored. For more information, visit: http://www.aguilar.org/ 5. What if there is a serious reaction? What should I look for?  Look for anything that concerns you, such as signs of a severe allergic reaction, very high fever, or unusual behavior. Signs of a severe allergic reaction can include hives, swelling of the face and throat, difficulty breathing, a fast heartbeat, dizziness, and weakness. These would  usually start a few minutes to a few hours after the vaccination. What should I do?  If you think it is a severe allergic reaction or other emergency that can't wait, call 9-1-1 or get the person to the nearest hospital. Otherwise, call your doctor.  Afterward, the reaction should be reported to the Vaccine Adverse Event Reporting System (VAERS). Your doctor might file this report, or you can do it yourself through the VAERS web site at www.vaers.SamedayNews.es, or by calling 205-068-1959. VAERS does not give medical advice. 6. The National Vaccine Injury Compensation Program The [employer] Vaccine Injury Compensation Program (VICP) is a federal program that was created to compensate people who may have been injured by certain vaccines. Persons who believe they may have been injured by a vaccine can learn about the program and about filing a claim by calling 409-026-5525 or visiting the [place] website at GoldCloset.com.ee. There is a time limit to file a claim for compensation. 7. How can I learn more?  Ask your doctor. He or she can give you the vaccine package insert or suggest other sources of information.  Call your local or state health department.  Contact the Centers for Disease Control and Prevention (CDC): ? Call (612) 815-4948 (1-800-CDC-INFO) ? Visit CDC's website at http://hunter.com/ Vaccine Information Statement Td Vaccine (12/30/15) This information is not intended to replace advice given to you by your health care provider. Make sure you discuss any questions you have with your health care provider. Document Released: 07/04/2006 Document Revised: 04/24/2018 Document Reviewed: 04/24/2018 Elsevier Interactive Patient Education  2019 [employer].

## 2018-11-10 LAB — LIPID PANEL
CHOLESTEROL TOTAL: 212 mg/dL — AB (ref 100–199)
Chol/HDL Ratio: 3.5 ratio (ref 0.0–4.4)
HDL: 61 mg/dL (ref >39)
LDL CALC: 137 mg/dL — AB (ref 0–99)
TRIGLYCERIDES: 72 mg/dL (ref 0–149)
VLDL CHOLESTEROL CAL: 14 mg/dL (ref 5–40)

## 2018-11-10 LAB — TSH: TSH: 4.24 u[IU]/mL (ref 0.450–4.500)

## 2018-11-10 LAB — COMPREHENSIVE METABOLIC PANEL
ALT: 21 IU/L (ref 0–32)
AST: 26 IU/L (ref 0–40)
Albumin/Globulin Ratio: 1.3 (ref 1.2–2.2)
Albumin: 4.6 g/dL (ref 3.8–4.9)
Alkaline Phosphatase: 62 IU/L (ref 39–117)
BUN/Creatinine Ratio: 23 (ref 12–28)
BUN: 13 mg/dL (ref 8–27)
Bilirubin Total: 0.3 mg/dL (ref 0.0–1.2)
CALCIUM: 9.6 mg/dL (ref 8.7–10.3)
CHLORIDE: 102 mmol/L (ref 96–106)
CO2: 25 mmol/L (ref 20–29)
CREATININE: 0.57 mg/dL (ref 0.57–1.00)
GFR calc Af Amer: 117 mL/min/{1.73_m2} (ref >59)
GFR calc non Af Amer: 101 mL/min/{1.73_m2} (ref >59)
Globulin, Total: 3.6 g/dL (ref 1.5–4.5)
Glucose: 89 mg/dL (ref 65–99)
Potassium: 4.4 mmol/L (ref 3.5–5.2)
Sodium: 141 mmol/L (ref 134–144)
TOTAL PROTEIN: 8.2 g/dL (ref 6.0–8.5)

## 2018-11-14 ENCOUNTER — Other Ambulatory Visit: Payer: Self-pay

## 2018-11-14 MED ORDER — TETANUS-DIPHTH-ACELL PERTUSSIS 5-2.5-18.5 LF-MCG/0.5 IM SUSP: 0.5000 mL | INTRAMUSCULAR | 0 refills | Status: DC

## 2018-11-23 ENCOUNTER — Inpatient Hospital Stay: Payer: Medicaid Other | Attending: Adult Health | Admitting: Adult Health

## 2018-11-23 ENCOUNTER — Encounter: Payer: Self-pay | Admitting: Adult Health

## 2018-12-05 LAB — FECAL OCCULT BLOOD, IMMUNOCHEMICAL: Fecal Occult Bld: POSITIVE — AB

## 2018-12-06 ENCOUNTER — Telehealth: Payer: Self-pay | Admitting: Critical Care Medicine

## 2018-12-06 DIAGNOSIS — R195 Other fecal abnormalities: Secondary | ICD-10-CM

## 2018-12-06 NOTE — Telephone Encounter (Signed)
I spoke to the patient and she is aware of heme positive stool  Hx of ulcerative colitis and GERD  She needs a GI referral and needs to get orange card completed.  Can you facilitate a time for her to meet with Clifton James

## 2018-12-06 NOTE — Telephone Encounter (Signed)
Please contact patient and schedule for a visit with financial and inform her of what is needed for the visit?

## 2018-12-08 ENCOUNTER — Telehealth: Payer: Self-pay | Admitting: Family Medicine

## 2018-12-08 ENCOUNTER — Other Ambulatory Visit: Payer: Self-pay

## 2018-12-08 ENCOUNTER — Ambulatory Visit: Payer: Self-pay | Attending: Family Medicine

## 2018-12-08 DIAGNOSIS — R195 Other fecal abnormalities: Secondary | ICD-10-CM

## 2018-12-08 DIAGNOSIS — Z8719 Personal history of other diseases of the digestive system: Secondary | ICD-10-CM

## 2018-12-08 NOTE — Telephone Encounter (Signed)
Gm Referral was sent on  12/06/2018 to  [place]  They will contact the patient to schedule an appointment

## 2018-12-08 NOTE — Telephone Encounter (Signed)
Patient came in to apply for the financial application and was pre approved for 100% CAFA. Patient stated she would like to make Dr.Wright aware for additional testing. Please follow up.

## 2018-12-08 NOTE — Telephone Encounter (Signed)
Now that pt has orange card next step is GI referral for heme pos stool I place referral today

## 2018-12-11 ENCOUNTER — Ambulatory Visit: Payer: Medicaid Other

## 2018-12-13 ENCOUNTER — Telehealth: Payer: Self-pay

## 2018-12-13 NOTE — Telephone Encounter (Signed)
Lm with info. Of phone visit for 3/26 @1 :00pm

## 2018-12-13 NOTE — Telephone Encounter (Signed)
Lmtcb/ref. Appt. Change to telephone visit

## 2018-12-14 ENCOUNTER — Other Ambulatory Visit: Payer: Self-pay

## 2018-12-14 ENCOUNTER — Telehealth (INDEPENDENT_AMBULATORY_CARE_PROVIDER_SITE_OTHER): Payer: Medicaid Other | Admitting: Gastroenterology

## 2018-12-14 DIAGNOSIS — Z85118 Personal history of other malignant neoplasm of bronchus and lung: Secondary | ICD-10-CM

## 2018-12-14 DIAGNOSIS — R12 Heartburn: Secondary | ICD-10-CM

## 2018-12-14 DIAGNOSIS — Z7982 Long term (current) use of aspirin: Secondary | ICD-10-CM

## 2018-12-14 DIAGNOSIS — R195 Other fecal abnormalities: Secondary | ICD-10-CM

## 2018-12-14 NOTE — Progress Notes (Signed)
This patient contacted our office requesting a physician telemedicine video consultation regarding clinical questions and/or test results.  If new patient, they were referred by Asencion Noble, MD  Participants on the phone call myself and the patient  The patient consented to phone consultation and was aware that a charge will be placed through their insurance.  I was in my office and the patient was at home  Encounter time: 30 minutes total (21 minutes spent with patient on phone/webex)   Wilfrid Lund, MD     ______________________________________________________          [facility] Consult Note:  History: Stacy [name] 12/14/2018  Referring physician: Boykin Nearing, MD  Reason for consult/chief complaint: No chief complaint on file.   Subjective  HPI:   11/2017 cancer center office note reviewed.  Survivorship of lung cancer diagnosed and treated many years ago. Recent office note establishing with [facility].  Reported history of GERD, treated with famotidine but she did not feel she needed it.   Subsequent stool testing positive-  patient on chronic aspirin, also recent NSAIDs for chronic back pain and now stopped.   Colonoscopy in 40's - normal as she recalls. No screening colon at 52 b/c lack of insurance. Had a recent episode of constipation.  PCP did stool study - no symptoms.  6 months of intermittent pyrosis, no dysphagia or regurgitation. Many years occ'l blood on paper.  ROS:  Review of Systems  Denies chronic dyspnea or exertional chest pain.  Past Medical History: Past Medical History:  Diagnosis Date  . Acquired hypothyroidism 04/24/2012  . Anxiety   . Depression with anxiety 04/24/2012  . H/O chronic ulcerative colitis 04/24/2012   Mild   . 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 Small cell lung  cancer ([place]) 04/24/2012   Presented 01/1999 w right supraclavicular lymph node enlargement. Rx Carboplatinum/etoposide X 6, Topotecan X 2, Radiation  . Vertigo      Past Surgical History: Past Surgical History:  Procedure Laterality Date  . ABDOMINAL HYSTERECTOMY    . laproscopy     Repair of ureteral injury during emergency hysterectomy when child was born  Family History: Family History  Problem Relation Age of Onset  . Hypertension Mother   . High Cholesterol Mother   . Prostate cancer Father   no fam Hx CRC   Social History: Social History   Socioeconomic History  . Marital status: Divorced    Spouse name: Not on file  . Number of children: 2  . Years of education: BA  . Highest education level: Not on file  Occupational History  . Occupation: [employer]  Social Needs  . Financial resource strain: Not on file  . Food insecurity:    Worry: Not on file    Inability: Not on file  . Transportation needs:    Medical: Not on file    Non-medical: Not on file  Tobacco Use  . Smoking status: Former Smoker    Last attempt to quit: 09/18/1999    Years since quitting: 19.2  . Smokeless tobacco: Never Used  Substance and Sexual Activity  . Alcohol use: Yes    Alcohol/week: 0.0 standard drinks    Comment: Ocasional   . Drug use: No  . Sexual activity: Never  Lifestyle  . Physical activity:    Days per week: Not on file    Minutes  per session: Not on file  . Stress: Not on file  Relationships  . Social connections:    Talks on phone: Not on file    Gets together: Not on file    Attends religious service: Not on file    Active member of club or organization: Not on file    Attends meetings of clubs or organizations: Not on file    Relationship status: Not on file  Other Topics Concern  . Not on file  Social History Narrative   Drinks 2 cups of tea a day     Allergies: No Known Allergies  Outpatient Meds: Current Outpatient Medications  Medication Sig Dispense  Refill  . aspirin EC 81 MG tablet Take 81 mg by mouth daily.     . famotidine (PEPCID) 20 MG tablet Take 1 tablet (20 mg total) by mouth at bedtime. 60 tablet 6  . ibuprofen (ADVIL,MOTRIN) 600 MG tablet Take 1 tablet (600 mg total) by mouth every 8 (eight) hours as needed for moderate pain. 30 tablet 0  . lactobacillus acidophilus (BACID) TABS tablet Take 2 tablets by mouth 3 (three) times daily.    . methocarbamol (ROBAXIN) 500 MG tablet Take 1 tablet (500 mg total) by mouth every 8 (eight) hours as needed for muscle spasms. 90 tablet 2  . Multiple Vitamin (MULTIVITAMIN WITH MINERALS) TABS Take 1 tablet by mouth daily.    . Omega-3 Fatty Acids (FISH OIL) 1000 MG CAPS Take 1 capsule by mouth daily.     Marland Kitchen OVER THE COUNTER MEDICATION VITAMIN D DAILY    . Tdap (BOOSTRIX) 5-2.5-18.5 LF-MCG/0.5 injection Inject 0.5 mLs into the muscle as directed. 0.5 mL 0   No current facility-administered medications for this visit.    exedrin regularly for back pain   ___________________________________________________________________ Objective    Labs:  Wt 191 pounds last month  Stool study occult blood positive on 11/30/2018   CMP Latest Ref Rng & Units 11/09/2018 06/22/2016 10/28/2015  Glucose 65 - 99 mg/dL 89 87 82  BUN 8 - 27 mg/dL 13 13 12   Creatinine 0.57 - 1.00 mg/dL 0.57 0.48(L) 0.62  Sodium 134 - 144 mmol/L 141 137 139  Potassium 3.5 - 5.2 mmol/L 4.4 4.4 4.9  Chloride 96 - 106 mmol/L 102 102 102  CO2 20 - 29 mmol/L 25 25 25   Calcium 8.7 - 10.3 mg/dL 9.6 9.5 9.1  Total Protein 6.0 - 8.5 g/dL 8.2 7.7 7.7  Total Bilirubin 0.0 - 1.2 mg/dL 0.3 0.3 0.4  Alkaline Phos 39 - 117 IU/L 62 54 51  AST 0 - 40 IU/L 26 14 18   ALT 0 - 32 IU/L 21 11 15    No CBC since 2017  Assessment: Encounter Diagnoses  Name Primary?  . Heme positive stool Yes  . Heartburn    No red flag upper or lower GI symptoms.  No need for EGD. Plan:  Colonoscopy in about 6 weeks, timing to be determined by current  pandemic related social distance restrictions.  Thank you for the courtesy of this consult.  Please call me with any questions or concerns.  Nelida Meuse III  CC: Referring provider noted above

## 2019-01-09 ENCOUNTER — Ambulatory Visit: Payer: Medicaid Other | Admitting: Nurse Practitioner

## 2019-01-31 ENCOUNTER — Telehealth: Payer: Self-pay | Admitting: Gastroenterology

## 2019-01-31 NOTE — Telephone Encounter (Signed)
Per Vivien Rota,  I left message for patient to callback to schedule pre visit appointment and colonoscopy.

## 2019-02-13 ENCOUNTER — Ambulatory Visit: Payer: Self-pay | Attending: Nurse Practitioner | Admitting: Nurse Practitioner

## 2019-02-13 ENCOUNTER — Other Ambulatory Visit: Payer: Self-pay

## 2019-02-13 ENCOUNTER — Encounter: Payer: Self-pay | Admitting: Nurse Practitioner

## 2019-02-13 DIAGNOSIS — S32000S Wedge compression fracture of unspecified lumbar vertebra, sequela: Secondary | ICD-10-CM

## 2019-02-13 DIAGNOSIS — M545 Low back pain, unspecified: Secondary | ICD-10-CM

## 2019-02-13 DIAGNOSIS — S22000A Wedge compression fracture of unspecified thoracic vertebra, initial encounter for closed fracture: Secondary | ICD-10-CM

## 2019-02-13 DIAGNOSIS — G8929 Other chronic pain: Secondary | ICD-10-CM

## 2019-02-13 DIAGNOSIS — Z85118 Personal history of other malignant neoplasm of bronchus and lung: Secondary | ICD-10-CM

## 2019-02-13 MED ORDER — DULOXETINE HCL 30 MG PO CPEP: 30.0000 mg | ORAL_CAPSULE | Freq: Every day | ORAL | 3 refills | Status: DC

## 2019-02-13 NOTE — Progress Notes (Signed)
Virtual Visit via Telephone Note Due to national recommendations of social distancing due to [place] 19, telehealth visit is felt to be most appropriate for this patient at this time.  I discussed the limitations, risks, security and privacy concerns of performing an evaluation and management service by telephone and the availability of in person appointments. I also discussed with the patient that there may be a patient responsible charge related to this service. The patient expressed understanding and agreed to proceed.    I connected with Stacy [name] on 02/13/19  at   8:30 AM EDTEDT by telephone and verified that I am speaking with the correct person using two identifiers.   Consent I discussed the limitations, risks, security and privacy concerns of performing an evaluation and management service by telephone and the availability of in person appointments. I also discussed with the patient that there may be a patient responsible charge related to this service. The patient expressed understanding and agreed to proceed.   Location of Patient: Private  Residence   Location of Provider: [place] and [place] participating in Telemedicine visit: Geryl Rankins FNP-BC [place]    History of Present Illness: Telemedicine visit for: Establish care Patient states she is in the process of scheduling with GI for colonoscopy. Had positive FIT test.  She was referred to [facility] for mammogram scheduling today.   Stacy [name] 63 y.o. female presents to office today to establish care. She has complaints today of chronic back pain which has been poorly relieved with NSAIDS and muscle relaxants.  PMH includes Acquired hypothyroidism (however TSH is normal with synthroid/levothyroxine), GERD (Not taking Pepcid) Small cell lung CA (now in remission s/p chemo and radiation. Has not seen Oncology since 11-2017).    Chronic Back Pain States  she broke her back in 2003. Treatment at that time included physical therapy, pain medications and back brace. She stated she was pain free until 5 years ago. Then she had a fall in 2017 and pain has persistently increased since that time.  She denies any involuntary loss of bowel or bladder. 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  Aggravating factors: Bending, twisting, prolonged sitting or standing.  Relieving factors: None.  Lumbar Xray 06-22-2016 IMPRESSION: Progression of T11 anterior compression now with approximately 75% height loss. Chronic stable L1 compression with approximately 25% anterior height loss. Lower lumbar facet arthropathy L3 through S1. Stable anterolisthesis grade 1 of L4 on L5.  Thoracic spine 06/22/2016 Chronic L1 superior endplate compression with 50% height loss. Approximately 75% anterior height loss of T11 versus 25% height loss on 02/15/2016 thoracic spine radiographs. Less than 5% superior endplate compression of T3 which is slightly more conspicuous on today's study but appears stable.    Past Medical History:  Diagnosis Date  . Acquired hypothyroidism 04/24/2012  . Anxiety   . Depression with anxiety 04/24/2012  . H/O chronic ulcerative colitis 04/24/2012   Mild   . 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 Small cell lung cancer ([place]) 04/24/2012   Presented 01/1999 w right supraclavicular lymph node enlargement. Rx Carboplatinum/etoposide X 6, Topotecan X 2, Radiation  . Vertigo     Past Surgical History:  Procedure Laterality Date  . ABDOMINAL HYSTERECTOMY    . laproscopy      Family History  Problem Relation Age of Onset  . Hypertension Mother   . High Cholesterol Mother   . Prostate cancer Father     Social History   Socioeconomic History  . Marital status: Divorced    Spouse name: Not on file  . Number of  children: 2  . Years of education: BA  . Highest education level: Not on file  Occupational History  . Occupation: [employer]  Social Needs  . Financial resource strain: Not on file  . Food insecurity:    Worry: Not on file    Inability: Not on file  . Transportation needs:    Medical: Not on file    Non-medical: Not on file  Tobacco Use  . Smoking status: Former Smoker    Last attempt to quit: 09/18/1999    Years since quitting: 19.4  . Smokeless tobacco: Never Used  Substance and Sexual Activity  . Alcohol use: Not Currently    Alcohol/week: 0.0 standard drinks    Comment: Ocasional   . Drug use: No  . Sexual activity: Never  Lifestyle  . Physical activity:    Days per week: Not on file    Minutes per session: Not on file  . Stress: Not on file  Relationships  . Social connections:    Talks on phone: Not on file    Gets together: Not on file    Attends religious service: Not on file    Active member of club or organization: Not on file    Attends meetings of clubs or organizations: Not on file    Relationship status: Not on file  Other Topics Concern  . Not on file  Social History Narrative   Drinks 2 cups of tea a day      Observations/Objective: Awake, alert and oriented x 3   Review of Systems  Constitutional: Negative for fever, malaise/fatigue and weight loss.  HENT: Negative.  Negative for nosebleeds.   Eyes: Negative.  Negative for blurred vision, double vision and photophobia.  Respiratory: Negative.  Negative for cough, hemoptysis, sputum production, shortness of breath and wheezing.   Cardiovascular: Negative.  Negative for chest pain, palpitations and leg swelling.  Gastrointestinal: Negative.  Negative for heartburn, nausea and vomiting.  Musculoskeletal: Positive for back pain. Negative for myalgias.  Neurological: Negative.  Negative for dizziness, focal weakness, seizures and headaches.  Psychiatric/Behavioral: Negative.  Negative for suicidal ideas.     Assessment and Plan: Diagnoses and all orders for this visit:  Chronic bilateral low back pain without sciatica -     Ambulatory referral to Orthopedic Surgery -     DULoxetine (CYMBALTA) 30 MG capsule; Take 1 capsule (30 mg total) by mouth daily for 30 days. Work on losing weight to help reduce back pain. May alternate with heat and ice application for pain relief. May also alternate with acetaminophen and Ibuprofen as prescribed for back pain. Other alternatives include massage, acupuncture and water aerobics.  You must stay active and avoid a sedentary lifestyle.   Compression fracture of body of thoracic vertebra [facility] -     Ambulatory referral to Orthopedic Surgery -     DG Thoracic Spine 2 View; Future -     DULoxetine (CYMBALTA) 30 MG capsule; Take 1 capsule (30 mg total) by mouth daily for 30 days.  Compression fracture of lumbar vertebra, sequela -     Ambulatory referral to Orthopedic Surgery -     DG Lumbar Spine Complete; Future -  DULoxetine (CYMBALTA) 30 MG capsule; Take 1 capsule (30 mg total) by mouth daily for 30 days.  History of lung cancer -     Ambulatory referral to Oncology -     DG Chest 2 View; Future Stopped smoking almost 20 years ago.     Follow Up Instructions Return in about 3 weeks (around 03/06/2019) for chronic back pain, started on cymbalta.     I discussed the assessment and treatment plan with the patient. The patient was provided an opportunity to ask questions and all were answered. The patient agreed with the plan and demonstrated an understanding of the instructions.   The patient was advised to call back or seek an in-person evaluation if the symptoms worsen or if the condition fails to improve as anticipated.  I provided 28 minutes of non-face-to-face time during this encounter including median intraservice time, reviewing previous notes, labs, imaging, medications and explaining diagnosis and management.  Gildardo Pounds, FNP-BC

## 2019-02-14 ENCOUNTER — Ambulatory Visit (HOSPITAL_COMMUNITY)
Admission: RE | Admit: 2019-02-14 | Discharge: 2019-02-14 | Disposition: A | Discharge status: Home / Self Care | Payer: Medicaid Other | Source: Ambulatory Visit | Attending: Nurse Practitioner | Admitting: Nurse Practitioner

## 2019-02-14 ENCOUNTER — Encounter: Payer: Self-pay | Admitting: Gastroenterology

## 2019-02-14 ENCOUNTER — Other Ambulatory Visit: Payer: Self-pay

## 2019-02-14 DIAGNOSIS — Z85118 Personal history of other malignant neoplasm of bronchus and lung: Secondary | ICD-10-CM | POA: Insufficient documentation

## 2019-02-14 DIAGNOSIS — S22000A Wedge compression fracture of unspecified thoracic vertebra, initial encounter for closed fracture: Secondary | ICD-10-CM | POA: Insufficient documentation

## 2019-02-14 DIAGNOSIS — S32000S Wedge compression fracture of unspecified lumbar vertebra, sequela: Secondary | ICD-10-CM | POA: Insufficient documentation

## 2019-02-19 ENCOUNTER — Telehealth: Payer: Self-pay | Admitting: Nurse Practitioner

## 2019-02-19 NOTE — Telephone Encounter (Signed)
New Message   Pt states she has questions about the medication she was just prescribed. DULoxetine (CYMBALTA) 30 MG capsule. Please f/u

## 2019-02-22 ENCOUNTER — Telehealth: Payer: Self-pay | Admitting: Family Medicine

## 2019-02-22 ENCOUNTER — Telehealth: Payer: Self-pay | Admitting: Physician Assistant

## 2019-02-22 NOTE — Telephone Encounter (Signed)
Patient called to get more information about the bills she has been receiving please follow up.

## 2019-02-22 NOTE — Telephone Encounter (Signed)
I spoke to the patient regarding her history of small cell lung cancer which was diagnosed and treated in 2000. She has not received treatment since this time. There is no additional steps or surveillance measures that need to be performed at this time besides her normal routine annual physicals. She may be released to her PCP for her routine annual physicals. She expressed understanding.

## 2019-02-23 NOTE — Telephone Encounter (Signed)
CMA attempt to call back patient. No answer and left a VM for a call back.

## 2019-02-27 ENCOUNTER — Other Ambulatory Visit: Payer: Self-pay

## 2019-02-27 ENCOUNTER — Ambulatory Visit (AMBULATORY_SURGERY_CENTER): Payer: Self-pay

## 2019-02-27 VITALS — Ht 64.0 in | Wt 194.0 lb

## 2019-02-27 DIAGNOSIS — R195 Other fecal abnormalities: Secondary | ICD-10-CM

## 2019-02-27 MED ORDER — NA SULFATE-K SULFATE-MG SULF 17.5-3.13-1.6 GM/177ML PO SOLN: 1.0000 | Freq: Once | ORAL | 0 refills | Status: AC

## 2019-02-27 NOTE — Progress Notes (Signed)
No egg or soy allergy known to patient  No issues with past sedation with any surgeries  or procedures, no intubation problems  No diet pills per patient No home 02 use per patient  No blood thinners per patient  Pt denies issues with constipation  No A fib or A flutter  EMMI video sent to pt's e mail  

## 2019-03-05 ENCOUNTER — Telehealth: Payer: Self-pay | Admitting: Nurse Practitioner

## 2019-03-05 NOTE — Telephone Encounter (Signed)
Patient called wanting to know if her financial assistance will cover her pre op drink [facility] prescribed drink. Please follow up. States she would like a VM left if she doesn't answer.

## 2019-03-06 ENCOUNTER — Ambulatory Visit: Payer: Self-pay | Attending: Nurse Practitioner | Admitting: Nurse Practitioner

## 2019-03-06 ENCOUNTER — Encounter: Payer: Self-pay | Admitting: Nurse Practitioner

## 2019-03-06 ENCOUNTER — Other Ambulatory Visit: Payer: Self-pay

## 2019-03-06 ENCOUNTER — Other Ambulatory Visit: Payer: Self-pay | Admitting: Gastroenterology

## 2019-03-06 DIAGNOSIS — E039 Hypothyroidism, unspecified: Secondary | ICD-10-CM | POA: Insufficient documentation

## 2019-03-06 DIAGNOSIS — Z8042 Family history of malignant neoplasm of prostate: Secondary | ICD-10-CM | POA: Insufficient documentation

## 2019-03-06 DIAGNOSIS — M545 Low back pain, unspecified: Secondary | ICD-10-CM

## 2019-03-06 DIAGNOSIS — Z85118 Personal history of other malignant neoplasm of bronchus and lung: Secondary | ICD-10-CM | POA: Insufficient documentation

## 2019-03-06 DIAGNOSIS — G8929 Other chronic pain: Secondary | ICD-10-CM

## 2019-03-06 DIAGNOSIS — Z87891 Personal history of nicotine dependence: Secondary | ICD-10-CM | POA: Insufficient documentation

## 2019-03-06 DIAGNOSIS — M79671 Pain in right foot: Secondary | ICD-10-CM | POA: Insufficient documentation

## 2019-03-06 DIAGNOSIS — Z130 Encounter for screening for diseases of the blood and blood-forming organs and certain disorders involving the immune mechanism: Secondary | ICD-10-CM

## 2019-03-06 DIAGNOSIS — Z8249 Family history of ischemic heart disease and other diseases of the circulatory system: Secondary | ICD-10-CM | POA: Insufficient documentation

## 2019-03-06 DIAGNOSIS — Z923 Personal history of irradiation: Secondary | ICD-10-CM | POA: Insufficient documentation

## 2019-03-06 DIAGNOSIS — R195 Other fecal abnormalities: Secondary | ICD-10-CM

## 2019-03-06 DIAGNOSIS — Z9221 Personal history of antineoplastic chemotherapy: Secondary | ICD-10-CM | POA: Insufficient documentation

## 2019-03-06 MED ORDER — IBUPROFEN 600 MG PO TABS
600.0000 mg | ORAL_TABLET | Freq: Three times a day (TID) | ORAL | 1 refills | Status: DC | PRN
Start: 2019-03-06 — End: 2024-06-28

## 2019-03-06 MED ORDER — SUPREP BOWEL PREP KIT 17.5-3.13-1.6 GM/177ML PO SOLN: 1.0000 | Freq: Once | ORAL | 0 refills | Status: AC

## 2019-03-06 MED FILL — SUPREP BOWEL PREP KIT: 17.5-3.13-1 | 1 days supply | Qty: 354 | Fill #0

## 2019-03-06 NOTE — Progress Notes (Signed)
Virtual Visit via Telephone Note Due to national recommendations of social distancing due to [place] 19, telehealth visit is felt to be most appropriate for this patient at this time.  I discussed the limitations, risks, security and privacy concerns of performing an evaluation and management service by telephone and the availability of in person appointments. I also discussed with the patient that there may be a patient responsible charge related to this service. The patient expressed understanding and agreed to proceed.    I connected with Stacy [name] on 03/06/19  at   8:30 AM EDT  EDT by telephone and verified that I am speaking with the correct person using two identifiers.   Consent I discussed the limitations, risks, security and privacy concerns of performing an evaluation and management service by telephone and the availability of in person appointments. I also discussed with the patient that there may be a patient responsible charge related to this service. The patient expressed understanding and agreed to proceed.   Location of Patient: Private Residence   Location of Provider: [place] participating in Telemedicine visit: Stacy Rankins FNP-BC [place]    History of Present Illness: Telemedicine visit for: Chronic back pain  She has chronic low back pain with compression fractures. Not willing to try Cymbalta, elavil or gabapentin as she "looked them up" and read that they were all used to treat depression. I attempted to explain to her that the medications can be used for other medical conditions including chronic pain or neuropathic pain.  At this time will refer to pain management.  She broke her back in 2003. Was pain free for several years then pain returned in 2017. Treatments in the past have included: physical therapy, back brace.  Currently works at [employer] in Therapist, art.  Thoracic xray  01-2019 Stable T11 compression deformity. Lumbar xray 01-2019 Chronic compression deformities of T11 and L1. No acute abnormality Noted.   Right Foot Pain  Bottom of foot feels like it's burning, pins and needles. Pain goes away on its own. "I can't say how long it lasts". Pain usually occurs at night. Pain described as tense.  She denies any injury or trauma.   Past Medical History:  Diagnosis Date  . Acquired hypothyroidism 04/24/2012  . Anxiety   . Blood transfusion without reported diagnosis   . Depression with anxiety 04/24/2012  . H/O chronic ulcerative colitis 04/24/2012   Mild   . 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 Small cell lung cancer ([place]) 04/24/2012   Presented 01/1999 w right supraclavicular lymph node enlargement. Rx Carboplatinum/etoposide X 6, Topotecan X 2, Radiation  . Vertigo     Past Surgical History:  Procedure Laterality Date  . ABDOMINAL HYSTERECTOMY    . laproscopy      Family History  Problem Relation Age of Onset  . Hypertension Mother   . High Cholesterol Mother   . Prostate cancer Father   . Colon cancer Neg Hx   . Esophageal cancer Neg Hx   . Rectal cancer Neg Hx   . Stomach cancer Neg Hx     Social History   Socioeconomic History  . Marital status: Divorced    Spouse name: Not on file  . Number of children: 2  . Years of education: BA  . Highest education level: Not on file  Occupational History  .  Occupation: [employer]  Social Needs  . Financial resource strain: Not on file  . Food insecurity    Worry: Not on file    Inability: Not on file  . Transportation needs    Medical: Not on file    Non-medical: Not on file  Tobacco Use  . Smoking status: Former Smoker    Quit date: 09/18/1999    Years since quitting: 19.4  . Smokeless tobacco: Never Used  Substance and Sexual Activity  . Alcohol use: Not Currently    Alcohol/week: 0.0 standard drinks     Comment: Ocasional   . Drug use: No  . Sexual activity: Never  Lifestyle  . Physical activity    Days per week: Not on file    Minutes per session: Not on file  . Stress: Not on file  Relationships  . Social Herbalist on phone: Not on file    Gets together: Not on file    Attends religious service: Not on file    Active member of club or organization: Not on file    Attends meetings of clubs or organizations: Not on file    Relationship status: Not on file  Other Topics Concern  . Not on file  Social History Narrative   Drinks 2 cups of tea a day      Observations/Objective: Awake, alert and oriented x 3   Review of Systems  Constitutional: Negative for fever, malaise/fatigue and weight loss.  HENT: Negative.  Negative for nosebleeds.   Eyes: Negative.  Negative for blurred vision, double vision and photophobia.  Respiratory: Negative.  Negative for cough and shortness of breath.   Cardiovascular: Negative.  Negative for chest pain, palpitations and leg swelling.  Gastrointestinal: Negative.  Negative for heartburn, nausea and vomiting.  Musculoskeletal: Positive for back pain. Negative for myalgias.  Neurological: Negative.  Negative for dizziness, focal weakness, seizures and headaches.  Psychiatric/Behavioral: Negative for suicidal ideas. The patient is nervous/anxious.     Assessment and Plan: Stacy [name] was seen today for follow-up and foot pain.  Diagnoses and all orders for this visit:  Chronic bilateral low back pain without sciatica -     Ambulatory referral to Physical Medicine Rehab -     ibuprofen (ADVIL) 600 MG tablet; Take 1 tablet (600 mg total) by mouth every 8 (eight) hours as needed. Work on losing weight to help reduce back pain. May alternate with heat and ice application for pain relief. May also alternate with Ibuprofen as prescribed for back pain. Other alternatives include massage, acupuncture and water aerobics.  You must stay active and  avoid a sedentary lifestyle.  Positive fecal immunochemical test -     CBC; Future She has a colonoscopy scheduled for 03-15-2019  Screening for deficiency anemia -     CBC; Future     Follow Up Instructions Return if symptoms worsen or fail to improve.     I discussed the assessment and treatment plan with the patient. The patient was provided an opportunity to ask questions and all were answered. The patient agreed with the plan and demonstrated an understanding of the instructions.   The patient was advised to call back or seek an in-person evaluation if the symptoms worsen or if the condition fails to improve as anticipated.  I provided 22 minutes of non-face-to-face time during this encounter including median intraservice time, reviewing previous notes, labs, imaging, medications and explaining diagnosis and management.  Gildardo Pounds, FNP-BC

## 2019-03-06 NOTE — Telephone Encounter (Signed)
Pt requested prep prescription to be sent to [facility].  At [facility] the prep soln is over $140

## 2019-03-06 NOTE — Telephone Encounter (Signed)
[facility] per pt's request. Lelan Pons pV

## 2019-03-06 NOTE — Telephone Encounter (Signed)
CMA addressed with patient. Patient will have [place] call the prep kit to our pharmacy a better price.

## 2019-03-07 ENCOUNTER — Other Ambulatory Visit: Payer: Self-pay

## 2019-03-07 ENCOUNTER — Ambulatory Visit: Payer: Self-pay | Attending: Nurse Practitioner

## 2019-03-07 DIAGNOSIS — Z130 Encounter for screening for diseases of the blood and blood-forming organs and certain disorders involving the immune mechanism: Secondary | ICD-10-CM

## 2019-03-07 DIAGNOSIS — R195 Other fecal abnormalities: Secondary | ICD-10-CM

## 2019-03-08 LAB — CBC
Hematocrit: 39.6 % (ref 34.0–46.6)
Hemoglobin: 12.8 g/dL (ref 11.1–15.9)
MCH: 28.8 pg (ref 26.6–33.0)
MCHC: 32.3 g/dL (ref 31.5–35.7)
MCV: 89 fL (ref 79–97)
Platelets: 377 10*3/uL (ref 150–450)
RBC: 4.44 x10E6/uL (ref 3.77–5.28)
RDW: 13.1 % (ref 11.7–15.4)
WBC: 9.2 10*3/uL (ref 3.4–10.8)

## 2019-03-14 ENCOUNTER — Telehealth: Payer: Self-pay | Admitting: Gastroenterology

## 2019-03-14 NOTE — Telephone Encounter (Signed)
Spoke w/patient regarding Covid-19 screening questions  Covid-19 Screening Questions:  Do you now or have you had a fever in the last 14 days? NO  Do you have any respiratory symptoms of shortness of breath or cough now or in the last 14 days? NO  Do you have any family members or close contacts with diagnosed or suspected Covid-19 in the past 14 days? NO   Have you been tested for Covid-19 and found to be positive?NO   Pt made aware of that care partner may wait in the car or come up to the lobby during the procedure but will need to provide their own mask.

## 2019-03-15 ENCOUNTER — Other Ambulatory Visit: Payer: Self-pay

## 2019-03-15 ENCOUNTER — Ambulatory Visit (AMBULATORY_SURGERY_CENTER): Payer: Self-pay | Admitting: Gastroenterology

## 2019-03-15 ENCOUNTER — Encounter: Payer: Self-pay | Admitting: Gastroenterology

## 2019-03-15 VITALS — BP 119/74 | HR 82 | Temp 98.6°F | Resp 15 | Ht 64.0 in | Wt 194.0 lb

## 2019-03-15 DIAGNOSIS — R195 Other fecal abnormalities: Secondary | ICD-10-CM

## 2019-03-15 DIAGNOSIS — K5730 Diverticulosis of large intestine without perforation or abscess without bleeding: Secondary | ICD-10-CM

## 2019-03-15 MED ORDER — SODIUM CHLORIDE 0.9 % IV SOLN: 500.0000 mL | Freq: Once | INTRAVENOUS | Status: DC

## 2019-03-15 NOTE — Progress Notes (Signed)
Stacy [name]- temp [name]- vitals 

## 2019-03-15 NOTE — Progress Notes (Signed)
Pt's states no medical or surgical changes since previsit or office visit. 

## 2019-03-15 NOTE — Progress Notes (Signed)
A and O x3. Report to RN. Tolerated MAC anesthesia well.

## 2019-03-15 NOTE — Patient Instructions (Signed)
YOU HAD AN ENDOSCOPIC PROCEDURE TODAY AT [facility]:   Refer to the procedure report that was given to you for any specific questions about what was found during the examination.  If the procedure report does not answer your questions, please call your gastroenterologist to clarify.  If you requested that your care partner not be given the details of your procedure findings, then the procedure report has been included in a sealed envelope for you to review at your convenience later.  YOU SHOULD EXPECT: Some feelings of bloating in the abdomen. Passage of more gas than usual.  Walking can help get rid of the air that was put into your GI tract during the procedure and reduce the bloating. If you had a lower endoscopy (such as a colonoscopy or flexible sigmoidoscopy) you may notice spotting of blood in your stool or on the toilet paper. If you underwent a bowel prep for your procedure, you may not have a normal bowel movement for a few days.  Please Note:  You might notice some irritation and congestion in your nose or some drainage.  This is from the oxygen used during your procedure.  There is no need for concern and it should clear up in a day or so.  SYMPTOMS TO REPORT IMMEDIATELY:   Following lower endoscopy (colonoscopy or flexible sigmoidoscopy):  Excessive amounts of blood in the stool  Significant tenderness or worsening of abdominal pains  Swelling of the abdomen that is new, acute  Fever of 100F or higher   For urgent or emergent issues, a gastroenterologist can be reached at any hour by calling [phone].   DIET:  We do recommend a small meal at first, but then you may proceed to your regular diet.  Drink plenty of fluids but you should avoid alcoholic beverages for 24 hours.  ACTIVITY:  You should plan to take it easy for the rest of today and you should NOT DRIVE or use heavy machinery until tomorrow (because of the sedation medicines used during the test).     FOLLOW UP: Our staff will call the number listed on your records 48-72 hours following your procedure to check on you and address any questions or concerns that you may have regarding the information given to you following your procedure. If we do not reach you, we will leave a message.  We will attempt to reach you two times.  During this call, we will ask if you have developed any symptoms of COVID 19. If you develop any symptoms (ie: fever, flu-like symptoms, shortness of breath, cough etc.) before then, please call [phone].  If you test positive for Covid 19 in the 2 weeks post procedure, please call and report this information to [place].    If any biopsies were taken you will be contacted by phone or by letter within the next 1-3 weeks.  Please call us at [phone] if you have not heard about the biopsies in 3 weeks.    SIGNATURES/CONFIDENTIALITY: You and/or your care partner have signed paperwork which will be entered into your electronic medical record.  These signatures attest to the fact that that the information above on your After Visit Summary has been reviewed and is understood.  Full responsibility of the confidentiality of this discharge information lies with you and/or your care-partner.   Handout was given to you on diverticulosis You may resume your current medications today. Repeat screening colonoscopy in 10 years. Please call if any questions  or concerns.

## 2019-03-15 NOTE — Progress Notes (Signed)
No problems noted in the recovery room. maw 

## 2019-03-15 NOTE — Op Note (Signed)
[facility] Patient Name: Stacy [name] Procedure Date: 03/15/2019 8:37 AM MRN: [id number] Endoscopist: Mallie Mussel L. Loletha Carrow , MD Age: 63 Referring MD:  Date of Birth: [birth year] Gender: Female Account #: [id number] Procedure:                Colonoscopy Indications:              Heme positive stool Medicines:                Monitored Anesthesia Care Procedure:                Pre-Anesthesia Assessment:                           - Prior to the procedure, a History and Physical                            was performed, and patient medications and                            allergies were reviewed. The patient's tolerance of                            previous anesthesia was also reviewed. The risks                            and benefits of the procedure and the sedation                            options and risks were discussed with the patient.                            All questions were answered, and informed consent                            was obtained. Prior Anticoagulants: The patient has                            taken no previous anticoagulant or antiplatelet                            agents except for aspirin. ASA Grade Assessment: II                            - A patient with mild systemic disease. After                            reviewing the risks and benefits, the patient was                            deemed in satisfactory condition to undergo the                            procedure.  After obtaining informed consent, the colonoscope                            was passed under direct vision. Throughout the                            procedure, the patient's blood pressure, pulse, and                            oxygen saturations were monitored continuously. The                            Model CF-HQ190L [id number]) scope was introduced                            through the anus and advanced to the the cecum,         identified by appendiceal orifice and ileocecal                            valve. The colonoscopy was performed without                            difficulty. The patient tolerated the procedure                            well. The quality of the bowel preparation was                            good. The ileocecal valve, appendiceal orifice, and                            rectum were photographed. Scope In: 8:46:37 AM Scope Out: 8:57:52 AM Scope Withdrawal Time: 0 hours 8 minutes 1 second  Total Procedure Duration: 0 hours 11 minutes 15 seconds  Findings:                 The perianal and digital rectal examinations were                            normal.                           Multiple diverticula were found in the left colon.                           There is no endoscopic evidence of polyps in the                            entire colon.                           The exam was otherwise without abnormality on                            direct and retroflexion views. Complications:  No immediate complications. Estimated Blood Loss:     Estimated blood loss: none. Impression:               - Diverticulosis in the left colon.                           - The examination was otherwise normal on direct                            and retroflexion views.                           - No specimens collected. Recommendation:           - Patient has a contact number available for                            emergencies. The signs and symptoms of potential                            delayed complications were discussed with the                            patient. Return to normal activities tomorrow.                            Written discharge instructions were provided to the                            patient.                           - Resume previous diet.                           - Continue present medications.                           - Repeat colonoscopy in 10 years  for screening                            purposes. Obinna Ehresman L. Loletha Carrow, MD 03/15/2019 9:02:20 AM This report has been signed electronically.

## 2019-03-19 ENCOUNTER — Telehealth: Payer: Self-pay | Admitting: *Deleted

## 2019-03-19 NOTE — Telephone Encounter (Signed)
  Follow up Call-  Call back number 03/15/2019  Post procedure Call Back phone  # [phone]  Permission to leave phone message Yes  Some recent data might be hidden     Patient questions:  Do you have a fever, pain , or abdominal swelling? No. Pain Score  0 *  Have you tolerated food without any problems? Yes.    Have you been able to return to your normal activities? Yes.    Do you have any questions about your discharge instructions: Diet   No. Medications  No. Follow up visit  No.  Do you have questions or concerns about your Care? Yes.   Developed hives on arms over the weekend.  Doesn't think its related to procedure.  Recommended trial of Benadryl and cool compresses, hydrocortisone cream. .  Contact PCP if no improvement tomorrow.  Actions: * If pain score is 4 or above: No action needed, pain <4.  1. Have you developed a fever since your procedure? NO  2.   Have you had an respiratory symptoms (SOB or cough) since your procedure? No  3.   Have you tested positive for COVID 19 since your procedure NO  4.   Have you had any family members/close contacts diagnosed with the COVID 19 since your procedure?  NO   If yes to any of these questions please route to Joylene John, RN and Alphonsa Gin, RN.

## 2019-03-21 ENCOUNTER — Encounter (HOSPITAL_COMMUNITY): Payer: Self-pay

## 2019-03-21 ENCOUNTER — Ambulatory Visit (HOSPITAL_COMMUNITY)
Admission: EM | Admit: 2019-03-21 | Discharge: 2019-03-21 | Disposition: A | Discharge status: Home / Self Care | Payer: Medicaid Other | Attending: Emergency Medicine | Admitting: Emergency Medicine

## 2019-03-21 ENCOUNTER — Other Ambulatory Visit: Payer: Self-pay

## 2019-03-21 DIAGNOSIS — R21 Rash and other nonspecific skin eruption: Secondary | ICD-10-CM

## 2019-03-21 MED ORDER — TRIAMCINOLONE ACETONIDE 0.1 % EX CREA: 1.0000 "application " | TOPICAL_CREAM | Freq: Two times a day (BID) | CUTANEOUS | 0 refills | Status: AC

## 2019-03-21 MED ORDER — METHYLPREDNISOLONE SODIUM SUCC 125 MG IJ SOLR
INTRAMUSCULAR | Status: AC
  Filled 2019-03-21: qty 2

## 2019-03-21 MED ORDER — PREDNISONE 50 MG PO TABS: 50.0000 mg | ORAL_TABLET | Freq: Every day | ORAL | 0 refills | Status: AC

## 2019-03-21 MED ORDER — CETIRIZINE HCL 10 MG PO CAPS: 10.0000 mg | ORAL_CAPSULE | Freq: Every day | ORAL | 0 refills | Status: AC

## 2019-03-21 MED ORDER — METHYLPREDNISOLONE SODIUM SUCC 125 MG IJ SOLR
125.0000 mg | Freq: Once | INTRAMUSCULAR | Status: AC
  Administered 2019-03-21: 125 mg via INTRAMUSCULAR

## 2019-03-21 MED ORDER — DOXYCYCLINE HYCLATE 100 MG PO CAPS: 100.0000 mg | ORAL_CAPSULE | Freq: Two times a day (BID) | ORAL | 0 refills | Status: AC

## 2019-03-21 MED FILL — ?DOXYCYCLINE HYCLATE 100MG: 100 | 10 days supply | Qty: 20 | Fill #0

## 2019-03-21 MED FILL — ?CETIRIZINE HCL 10 MG TABLE: 10 | 10 days supply | Qty: 10 | Fill #0

## 2019-03-21 MED FILL — ?PREDNISONE 10 MG TABLET: 10 | 5 days supply | Qty: 25 | Fill #0

## 2019-03-21 MED FILL — ?TRIAMCINOLONE 0.1% CRM: 0.1 | 15 days supply | Qty: 30 | Fill #0

## 2019-03-21 NOTE — Discharge Instructions (Signed)
We gave you a shot of Solu-Medrol today Please continue with prednisone daily for the next 5 days with food on Your stomach, please do not start this until later tonight or tomorrow Please continue to also use antihistamines, may try daily cetirizine supplement with Benadryl at nighttime May apply triamcinolone/Kenalog cream twice daily to areas of significant itching that are not relieved with prednisone alone May begin doxycycline twice daily to cover for infection  Please follow-up if symptoms not resolving or worsening, developing fevers, nausea vomiting

## 2019-03-21 NOTE — ED Triage Notes (Signed)
Patient presents to Urgent Care with complaints of rash on bilateral forearms since 5 days ago. Patient reports the rash is very itchy, has now spread to her upper thighs, pt states she has never had chicken pox; rash is in linear shape.

## 2019-03-21 NOTE — ED Provider Notes (Signed)
[facility]    CSN: [id number] Arrival date & time: 03/21/19  0805      History   Chief Complaint Chief Complaint  Patient presents with   Rash    HPI Stacy [name] is a 63 y.o. female history of small cell lung cancer, GERD, presenting today for evaluation of a rash.  Patient states that approximately 5 days ago she developed a rash to her bilateral forearms.  Rash is been associated with itching and pain.  Of recently she has noticed the rash is spread to her upper thighs.  She is concerned about infection as she has had a lot of pain with it and concerned about the surrounding redness.  She denies any fevers chills or body aches.  Denies nausea or vomiting.  Denies recent tick bites.  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  Denies other new exposures, denies recently working out in the yard or with exposure to woods.  Denies new soaps lotions or detergents.  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 Rx Carboplatinum/etoposide X 6, Topotecan X 2, Radiation   Vertigo     Patient Active Problem List   Diagnosis Date Noted   Gastroesophageal reflux disease without esophagitis 11/09/2018   Chronic low back pain 10/28/2015   Benign paroxysmal positional vertigo 10/28/2015   Chronic fatigue 03/30/2013   History of lung cancer 03/30/2013   Small cell lung cancer ([place]) 04/24/2012   Acquired hypothyroidism 04/24/2012   Serous cystadenoma of  ovary 04/24/2012   H/O chronic ulcerative colitis 04/24/2012   Depression with anxiety 04/24/2012    Past Surgical History:  Procedure Laterality Date   ABDOMINAL HYSTERECTOMY     laproscopy      OB History   No obstetric history on file.      Home Medications    Prior to Admission medications   Medication Sig Start Date End Date Taking? Authorizing Provider  aspirin EC 81 MG tablet Take 81 mg by mouth daily.     [provider]  Cetirizine HCl 10 MG CAPS Take 1 capsule (10 mg total) by mouth daily for 10 days. 03/21/19 03/31/19  Jru Pense C, PA-C  doxycycline (VIBRAMYCIN) 100 MG capsule Take 1 capsule (100 mg total) by mouth 2 (two) times daily for 10 days. 03/21/19 03/31/19  Babacar Haycraft C, PA-C  ibuprofen (ADVIL) 600 MG tablet Take 1 tablet (600 mg total) by mouth every 8 (eight) hours as needed. 03/06/19   Gildardo Pounds, NP  lactobacillus acidophilus (BACID) TABS tablet Take 2 tablets by mouth 3 (three) times daily.    [provider]  Multiple Vitamin (MULTIVITAMIN WITH MINERALS) TABS Take 1 tablet by mouth daily.    [provider]  Omega-3 Fatty Acids (FISH OIL) 1000 MG CAPS Take 1 capsule by mouth daily.     [provider]  OVER THE COUNTER MEDICATION VITAMIN D DAILY    [provider]  predniSONE (DELTASONE)  50 MG tablet Take 1 tablet (50 mg total) by mouth daily for 5 days. 03/21/19 03/26/19  Jheremy Boger C, PA-C  triamcinolone cream (KENALOG) 0.1 % Apply 1 application topically 2 (two) times daily. 03/21/19   Lashawna Poche C, PA-C  DULoxetine (CYMBALTA) 30 MG capsule Take 1 capsule (30 mg total) by mouth daily for 30 days. Patient not taking: Reported on 02/27/2019 02/13/19 03/21/19  Gildardo Pounds, NP    Family History Family History  Problem Relation Age of Onset   Hypertension Mother    High Cholesterol Mother    Prostate cancer Father    Colon cancer Neg Hx    Esophageal cancer Neg Hx    Rectal cancer  Neg Hx    Stomach cancer Neg Hx     Social History Social History   Tobacco Use   Smoking status: Former Smoker    Quit date: 09/18/1999    Years since quitting: 19.5   Smokeless tobacco: Never Used  Substance Use Topics   Alcohol use: Not Currently    Alcohol/week: 0.0 standard drinks    Comment: Ocasional    Drug use: No     Allergies   Patient has no known allergies.   Review of Systems Review of Systems  Constitutional: Negative for fatigue and fever.  HENT: Negative for mouth sores.   Eyes: Negative for visual disturbance.  Respiratory: Negative for shortness of breath.   Cardiovascular: Negative for chest pain.  Gastrointestinal: Negative for abdominal pain, nausea and vomiting.  Genitourinary: Negative for genital sores.  Musculoskeletal: Negative for arthralgias and joint swelling.  Skin: Positive for color change and rash. Negative for wound.  Neurological: Negative for dizziness, weakness, light-headedness and headaches.     Physical Exam Triage Vital Signs ED Triage Vitals  Enc Vitals Group     BP 03/21/19 0820 (!) 107/56     Pulse Rate 03/21/19 0820 92     Resp 03/21/19 0820 18     Temp 03/21/19 0820 98.3 F (36.8 C)     Temp Source 03/21/19 0820 Oral     SpO2 03/21/19 0820 100 %     Weight --      Height --      Head Circumference --      Peak Flow --      Pain Score 03/21/19 0818 0     Pain Loc --      Pain Edu? --      Excl. in [place]? --    No data found.  Updated Vital Signs BP (!) 107/56 (BP Location: Right Arm)    Pulse 92    Temp 98.3 F (36.8 C) (Oral)    Resp 18    SpO2 100%   Visual Acuity Right Eye Distance:   Left Eye Distance:   Bilateral Distance:    Right Eye Near:   Left Eye Near:    Bilateral Near:     Physical Exam Vitals signs and nursing note reviewed.  Constitutional:      Appearance: She is well-developed.     Comments: No acute distress  HENT:     Head: Normocephalic and atraumatic.     Nose: Nose  normal.  Eyes:     Conjunctiva/sclera: Conjunctivae normal.  Neck:     Musculoskeletal: Neck supple.  Cardiovascular:     Rate and Rhythm: Normal rate.  Pulmonary:     Effort: Pulmonary effort is normal. No respiratory distress.  Abdominal:     General:  There is no distension.  Musculoskeletal: Normal range of motion.  Skin:    General: Skin is warm and dry.     Comments: Bilateral forearms with linear slightly crusted areas with surrounding erythema, bilateral proximal thighs with circular erythematous areas  No rash noted to face or neck  Neurological:     Mental Status: She is alert and oriented to person, place, and time.      UC Treatments / Results  Labs (all labs ordered are listed, but only abnormal results are displayed) Labs Reviewed - No data to display  EKG   Radiology No results found.  Procedures Procedures (including critical care time)  Medications Ordered in UC Medications  methylPREDNISolone sodium succinate (SOLU-MEDROL) 125 mg/2 mL injection 125 mg (has no administration in time range)    Initial Impression / Assessment and Plan / UC Course  I have reviewed the triage vital signs and the nursing notes.  Pertinent labs & imaging results that were available during my care of the patient were reviewed by me and considered in my medical decision making (see chart for details).     Patient with rash, do not suspect shingles given bilateral nature.  Appears to be some form of contact dermatitis, possibly related to chemical exposure at work.  Will provide Solu-Medrol today.  Will also continue on prednisone daily.  Recommended antihistamines.  Topical Kenalog for further itch relief as needed.  Given patient's concern for infection we will go ahead and cover for this as well given her pain it does appear she has been scratching and may have developed secondary infection.  Will initiate on doxycycline.  Breathing stable at this time.Discussed strict  return precautions. Patient verbalized understanding and is agreeable with plan.  Final Clinical Impressions(s) / UC Diagnoses   Final diagnoses:  Rash and nonspecific skin eruption     Discharge Instructions     We gave you a shot of Solu-Medrol today Please continue with prednisone daily for the next 5 days with food on Your stomach, please do not start this until later tonight or tomorrow Please continue to also use antihistamines, may try daily cetirizine supplement with Benadryl at nighttime May apply triamcinolone/Kenalog cream twice daily to areas of significant itching that are not relieved with prednisone alone May begin doxycycline twice daily to cover for infection  Please follow-up if symptoms not resolving or worsening, developing fevers, nausea vomiting   ED Prescriptions    Medication Sig Dispense Auth. Provider   doxycycline (VIBRAMYCIN) 100 MG capsule Take 1 capsule (100 mg total) by mouth 2 (two) times daily for 10 days. 20 capsule Marijayne Rauth C, PA-C   Cetirizine HCl 10 MG CAPS Take 1 capsule (10 mg total) by mouth daily for 10 days. 10 capsule Kajal Scalici C, PA-C   predniSONE (DELTASONE) 50 MG tablet Take 1 tablet (50 mg total) by mouth daily for 5 days. 5 tablet Lillianne Eick C, PA-C   triamcinolone cream (KENALOG) 0.1 % Apply 1 application topically 2 (two) times daily. 45 g Isaia Hassell, [place] C, PA-C     Controlled Substance Prescriptions [place] Controlled Substance Registry consulted? Not Applicable   Janith Lima, [place] 03/21/19 [phone]

## 2019-03-22 ENCOUNTER — Telehealth: Payer: Self-pay | Admitting: Nurse Practitioner

## 2019-03-22 NOTE — Telephone Encounter (Signed)
Pt states she received a missed call from here this morning. Pt states our office is referring her to pain management and may be calling about that appt.  Pt request call back and leave detail msg on her phone pt states she is unable to answer at work and needs the detail vm so she can return the call.

## 2019-03-22 NOTE — Telephone Encounter (Signed)
I Lvm to  [facility]  To follow up on the referral

## 2019-03-22 NOTE — Telephone Encounter (Signed)
Called pt LVM to contact Physical Medicine Rehab for referral placed.

## 2019-03-30 ENCOUNTER — Encounter: Payer: Self-pay | Admitting: Physical Medicine & Rehabilitation

## 2019-04-11 ENCOUNTER — Other Ambulatory Visit: Payer: Medicaid Other | Admitting: Nurse Practitioner

## 2019-05-11 ENCOUNTER — Encounter: Payer: Self-pay | Admitting: Physical Medicine & Rehabilitation

## 2019-05-24 ENCOUNTER — Ambulatory Visit: Payer: Self-pay | Attending: Nurse Practitioner | Admitting: Pharmacist

## 2019-05-24 ENCOUNTER — Encounter: Payer: Self-pay | Admitting: Physical Medicine & Rehabilitation

## 2019-05-24 ENCOUNTER — Encounter: Payer: Self-pay | Attending: Physical Medicine & Rehabilitation | Admitting: Physical Medicine & Rehabilitation

## 2019-05-24 ENCOUNTER — Other Ambulatory Visit: Payer: Self-pay

## 2019-05-24 VITALS — BP 105/73 | HR 87 | Temp 98.5°F | Ht 65.0 in | Wt 195.0 lb

## 2019-05-24 DIAGNOSIS — M545 Low back pain: Secondary | ICD-10-CM | POA: Insufficient documentation

## 2019-05-24 DIAGNOSIS — G8929 Other chronic pain: Secondary | ICD-10-CM

## 2019-05-24 DIAGNOSIS — M47816 Spondylosis without myelopathy or radiculopathy, lumbar region: Secondary | ICD-10-CM

## 2019-05-24 DIAGNOSIS — Z23 Encounter for immunization: Secondary | ICD-10-CM

## 2019-05-24 DIAGNOSIS — Z5181 Encounter for therapeutic drug level monitoring: Secondary | ICD-10-CM

## 2019-05-24 MED ORDER — TIZANIDINE HCL 4 MG PO TABS: 4.0000 mg | ORAL_TABLET | Freq: Every day | ORAL | 1 refills | Status: AC

## 2019-05-24 MED FILL — tiZANidine HCL 4 MG TABS: 4 | 30 days supply | Qty: 30 | Fill #0

## 2019-05-24 NOTE — Progress Notes (Signed)
Subjective:    Patient ID: Stacy [name], female    DOB: 26-Mar-1956, 63 y.o.   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  Standing most of day  Prolonged sitting or standing causes stiffness in back  Hx fall in 2003 with  L1 comp fx Hx fall in 2017 with slight T11 comp fx Takes excedrin which helps about 4-5 hrs Had therapy after comp fx which was helpful  Feels like her core is weaker Difficulty with pelvic lift Tried muscle creams which did not help  Saw a pain clinic MD.when she had medicaid took one pink pill a day which helped  "Neuropathy in feet" , hx of cisplatin, also hx of brain radiation   Pain Inventory Average Pain 6 Pain Right Now 6 My pain is sharp, stabbing, tingling and aching  In the last 24 hours, has pain interfered with the following? General activity 8 Relation with others 8 Enjoyment of life 8 What TIME of day is your pain at its worst? evening Sleep (in general) Fair  Pain is worse with: standing and some activites Pain improves with: rest and medication Relief from Meds: 3  Mobility walk without assistance ability to climb steps?  yes do you drive?  yes  Function employed # of hrs/week 40  Neuro/Psych numbness tingling  Prior Studies new pt  Physicians involved in your care new pt   Family History  Problem Relation Age of Onset  . Hypertension Mother   . High Cholesterol Mother   . Prostate cancer Father   . Colon cancer Neg Hx   . Esophageal cancer Neg Hx   . Rectal cancer Neg Hx   . Stomach cancer Neg Hx    Social History   Socioeconomic History  . Marital status: Divorced    Spouse name: Not on file  . Number of children: 2  . Years of education: BA  . Highest education level: Not on file  Occupational History  . Occupation: [employer]  Social Needs  . Financial resource strain: Not on file  . Food insecurity    Worry: Not on file    Inability: Not on  file  . Transportation needs    Medical: Not on file    Non-medical: Not on file  Tobacco Use  . Smoking status: Former Smoker    Quit date: 09/18/1999    Years since quitting: 19.6  . Smokeless tobacco: Never Used  Substance and Sexual Activity  . Alcohol use: Not Currently    Alcohol/week: 0.0 standard drinks    Comment: Ocasional   . Drug use: No  . Sexual activity: Never  Lifestyle  . Physical activity    Days per week: Not on file    Minutes per session: Not on file  . Stress: Not on file  Relationships  . Social Herbalist on phone: Not on file    Gets together: Not on file    Attends religious service: Not on file    Active member of club or organization: Not on file    Attends meetings of clubs or organizations: Not on file    Relationship status: Not on file  Other Topics Concern  . Not on file  Social History Narrative   Drinks 2 cups of tea a day    Past Surgical History:  Procedure Laterality Date  . ABDOMINAL HYSTERECTOMY    . laproscopy  Past Medical History:  Diagnosis Date  . Acquired hypothyroidism 04/24/2012  . Anxiety   . Blood transfusion without reported diagnosis   . Depression with anxiety 04/24/2012  . H/O chronic ulcerative colitis 04/24/2012   Mild   . 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 Small cell lung cancer ([place]) 04/24/2012   Presented 01/1999 w right supraclavicular lymph node enlargement. Rx Carboplatinum/etoposide X 6, Topotecan X 2, Radiation  . Vertigo    BP 105/73   Pulse 87   Temp 98.5 F (36.9 C)   Ht 5\' 5"  (1.651 m)   Wt 195 lb (88.5 kg)   SpO2 95%   BMI 32.45 kg/m   Opioid Risk Score:   Fall Risk Score:  `1  Depression screen PHQ 2/9  Depression screen [facility] 2/9 03/06/2019 02/13/2019 11/09/2018 07/30/2016 06/22/2016 03/30/2016 10/28/2015  Decreased Interest 0 0 0 0 1 1 0  Down, Depressed, Hopeless 0 0 1 2 1 1 1   PHQ - 2 Score  0 0 1 2 2 2 1   Altered sleeping 1 1 - 2 2 1 3   Tired, decreased energy 0 0 - 1 1 1 1   Change in appetite 0 0 - 0 0 1 2  Feeling bad or failure about yourself  0 0 - 0 2 2 1   Trouble concentrating 0 0 - 0 0 1 0  Moving slowly or fidgety/restless 0 0 - 0 0 0 0  Suicidal thoughts 0 0 - 0 0 0 0  PHQ-9 Score 1 1 - 5 7 8 8      Review of Systems  Constitutional: Negative.   HENT: Negative.   Eyes: Negative.   Respiratory: Negative.   Cardiovascular: Negative.   Gastrointestinal: Negative.   Endocrine: Negative.   Genitourinary: Negative.   Musculoskeletal: Positive for arthralgias, back pain and myalgias.  Skin: Negative.   Allergic/Immunologic: Negative.   Neurological: Positive for numbness.  Hematological: Negative.   Psychiatric/Behavioral: Negative.   All other systems reviewed and are negative.      Objective:   Physical Exam Vitals signs and nursing note reviewed.  Constitutional:      Appearance: Normal appearance.  HENT:     Head: Normocephalic and atraumatic.     Mouth/Throat:     Mouth: Mucous membranes are dry.  Neck:     Musculoskeletal: Normal range of motion and neck supple.  Cardiovascular:     Rate and Rhythm: Normal rate and regular rhythm.     Heart sounds: Normal heart sounds.  Pulmonary:     Effort: Pulmonary effort is normal.     Breath sounds: Normal breath sounds.  Abdominal:     General: Abdomen is flat. Bowel sounds are normal.     Palpations: Abdomen is soft.  Musculoskeletal:     Comments: Pes planus noted bilateral feet  Mild tenderness palpation at L4-5 S1 paraspinal area There is pain with extension in this area but not with flexion. There is minimal tenderness around the thoracolumbar junction  Skin:    General: Skin is warm and dry.  Neurological:     General: No focal deficit present.     Mental Status: She is alert and oriented to person, place, and time.     Gait: Gait is intact.     Comments: Motor strength is 5/5 bilateral  deltoid bicep tricep grip hip flexors knee extensor ankle dorsiflexor Negative straight leg raising bilaterally Sensation  is intact to pinprick as well as light touch in bilateral lower extremities.  No evidence of stocking distribution numbness. There is no atrophy in the intrinsic muscles of the feet or hands  Psychiatric:        Mood and Affect: Mood normal.        Behavior: Behavior normal.           Assessment & Plan:  1.  Chronic lumbar pain.  We discussed the location of her old compression fractures and while she has some pain in that region most of her pain is in the lower lumbar area We discussed that x-rays demonstrate some lumbar spondylosis in that area.  The pain with extension is also consistent with this. We discussed the need to improve core strengthening.  I have given her some exercises for this as well as refer to physical therapy. She has some nocturnal pain that keeps her up at night.  We discussed that some of this may be muscle spasm.  We will start tizanidine.  Will hold off any daytime medications to avoid drowsiness. I will see her back in approximately 6 weeks if not much better with therapy may consider medial branch blocks Hold off on UDS for now, trial nonnarcotic at first.  Do not think patient will need strong narcotics in the future but possibly some tramadol if other treatments are not helpful

## 2019-05-24 NOTE — Patient Instructions (Signed)

## 2019-05-24 NOTE — Progress Notes (Signed)
Patient presents for vaccination against influenza per orders of Zelda. Consent given. Counseling provided. No contraindications exists. Vaccine administered without incident.   

## 2019-06-27 ENCOUNTER — Encounter: Payer: Self-pay | Attending: Physical Medicine & Rehabilitation | Admitting: Physical Medicine & Rehabilitation

## 2019-06-27 DIAGNOSIS — M545 Low back pain: Secondary | ICD-10-CM | POA: Insufficient documentation

## 2019-06-27 DIAGNOSIS — G8929 Other chronic pain: Secondary | ICD-10-CM | POA: Insufficient documentation

## 2019-06-27 DIAGNOSIS — M47816 Spondylosis without myelopathy or radiculopathy, lumbar region: Secondary | ICD-10-CM | POA: Insufficient documentation

## 2019-06-27 DIAGNOSIS — Z5181 Encounter for therapeutic drug level monitoring: Secondary | ICD-10-CM | POA: Insufficient documentation

## 2020-05-20 MED FILL — tiZANidine HCL 4 MG TABS: 4 | 30 days supply | Qty: 30 | Fill #1

## 2022-11-11 ENCOUNTER — Ambulatory Visit (HOSPITAL_COMMUNITY)
Admission: EM | Admit: 2022-11-11 | Discharge: 2022-11-11 | Disposition: A | Discharge status: Home / Self Care | Payer: BLUE CROSS/BLUE SHIELD | Attending: Internal Medicine | Admitting: Internal Medicine

## 2022-11-11 ENCOUNTER — Encounter (HOSPITAL_COMMUNITY): Payer: Self-pay | Admitting: Emergency Medicine

## 2022-11-11 ENCOUNTER — Other Ambulatory Visit: Payer: Self-pay

## 2022-11-11 DIAGNOSIS — S61011A Laceration without foreign body of right thumb without damage to nail, initial encounter: Secondary | ICD-10-CM | POA: Diagnosis not present

## 2022-11-11 DIAGNOSIS — S61012A Laceration without foreign body of left thumb without damage to nail, initial encounter: Secondary | ICD-10-CM

## 2022-11-11 MED ORDER — LIDOCAINE HCL 2 % IJ SOLN
INTRAMUSCULAR | Status: AC
  Filled 2022-11-11: qty 20

## 2022-11-11 MED ORDER — LIDOCAINE-EPINEPHRINE-TETRACAINE (LET) TOPICAL GEL
TOPICAL | Status: AC
  Filled 2022-11-11: qty 3

## 2022-11-11 NOTE — Discharge Instructions (Addendum)
Daily wound dressing changes After 3 days okay to wash with mild soap and water Please avoid cooking or immersing your fingers in water over up for prolonged period of time Tylenol as needed for pain If you notice worsening pain, redness, discharge please return to urgent care to be reevaluated Come back to urgent care in 7 to 10 days for sutures removal

## 2022-11-11 NOTE — ED Triage Notes (Signed)
Incident occurred about 2 hours ago.  Cut right thumb while slicing foods .  Patient reports it has bled  continuously

## 2022-11-12 DIAGNOSIS — S61011A Laceration without foreign body of right thumb without damage to nail, initial encounter: Secondary | ICD-10-CM | POA: Diagnosis not present

## 2022-11-12 NOTE — ED Provider Notes (Signed)
[facility]    CSN: [id number] Arrival date & time: 11/11/22  1937      History   Chief Complaint Chief Complaint  Patient presents with   Laceration    HPI Stacy [name] is a 67 y.o. female comes to the urgent care with laceration of the right thumb.  Patient was slicing some onions when the laceration occurred.  Bleeding was difficult to control.  Patient denies any anticoagulation use.   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      Home Medications    Prior to Admission medications   Medication Sig Start Date End Date Taking? Authorizing Provider  alendronate (FOSAMAX) 70 MG tablet Take by mouth. 06/21/22  Yes [provider]  aspirin EC 81 MG tablet Take 81 mg by mouth daily.     [provider]  Cetirizine HCl 10 MG CAPS Take 1 capsule (10  mg total) by mouth daily for 10 days. 03/21/19 03/31/19  Wieters, Hallie C, PA-C  ibuprofen (ADVIL) 600 MG tablet Take 1 tablet (600 mg total) by mouth every 8 (eight) hours as needed. 03/06/19   Gildardo Pounds, NP  lactobacillus acidophilus (BACID) TABS tablet Take 2 tablets by mouth 3 (three) times daily.    [provider]  Multiple Vitamin (MULTIVITAMIN WITH MINERALS) TABS Take 1 tablet by mouth daily.    [provider]  Omega-3 Fatty Acids (FISH OIL) 1000 MG CAPS Take 1 capsule by mouth daily.     [provider]  OVER THE COUNTER MEDICATION VITAMIN D DAILY    [provider]  tiZANidine (ZANAFLEX) 4 MG tablet Take 1 tablet (4 mg total) by mouth at bedtime. 05/24/19   Kirsteins, Luanna Salk, MD  triamcinolone cream (KENALOG) 0.1 % Apply 1 application topically 2 (two) times daily. 03/21/19   Wieters, Hallie C, PA-C  DULoxetine (CYMBALTA) 30 MG capsule Take 1 capsule (30 mg total) by mouth daily for 30 days. Patient not taking: Reported on 02/27/2019 02/13/19 03/21/19  Gildardo Pounds, NP    Family History Family History  Problem Relation Age of Onset   Hypertension Mother    High Cholesterol Mother    Prostate cancer Father    Colon cancer Neg Hx    Esophageal cancer Neg Hx    Rectal cancer Neg Hx  Stomach cancer Neg Hx     Social History Social History   Tobacco Use   Smoking status: Former    Types: Cigarettes    Quit date: 09/18/1999    Years since quitting: 23.1   Smokeless tobacco: Never  Vaping Use   Vaping Use: Never used  Substance Use Topics   Alcohol use: Not Currently    Alcohol/week: 0.0 standard drinks of alcohol    Comment: Ocasional    Drug use: No     Allergies   Patient has no known allergies.   Review of Systems Review of Systems  Skin:  Positive for wound. Negative for color change.     Physical Exam Triage Vital Signs ED Triage Vitals  Enc Vitals Group     BP 11/11/22 1952 137/84     Pulse Rate 11/11/22 1952  95     Resp 11/11/22 1952 18     Temp 11/11/22 1952 98 F (36.7 C)     Temp Source 11/11/22 1952 Oral     SpO2 11/11/22 1952 99 %     Weight --      Height --      Head Circumference --      Peak Flow --      Pain Score 11/11/22 1948 0     Pain Loc --      Pain Edu? --      Excl. in [place]? --    No data found.  Updated Vital Signs BP 137/84 (BP Location: Right Arm)   Pulse 95   Temp 98 F (36.7 C) (Oral)   Resp 18   SpO2 99%   Visual Acuity Right Eye Distance:   Left Eye Distance:   Bilateral Distance:    Right Eye Near:   Left Eye Near:    Bilateral Near:     Physical Exam Vitals and nursing note reviewed.  Constitutional:      General: She is in acute distress.     Appearance: She is not ill-appearing.  Musculoskeletal:        General: Normal range of motion.     Comments: Full range of motion of the right thumb.  1 inch laceration on the palmar aspect of the right thumb.  Bleeding is controlled.  Neurological:     Mental Status: She is alert.      UC Treatments / Results  Labs (all labs ordered are listed, but only abnormal results are displayed) Labs Reviewed - No data to display  EKG   Radiology No results found.  Procedures Laceration Repair  Date/Time: 11/12/2022 12:24 PM  Performed by: Chase Picket, MD Authorized by: Chase Picket, MD   Consent:    Consent obtained:  Verbal   Consent given by:  Patient   Risks discussed:  Infection, pain and poor cosmetic result Universal protocol:    Patient identity confirmed:  Verbally with patient Anesthesia:    Anesthesia method:  Local infiltration   Local anesthetic:  Lidocaine 1% w/o epi Laceration details:    Location:  Finger   Finger location:  R thumb   Length (cm):  2   Depth (mm):  3 Pre-procedure details:    Preparation:  Patient was prepped and draped in usual sterile fashion Exploration:    Hemostasis achieved with:  Direct pressure   Wound exploration: wound explored  through full range of motion     Contaminated: no   Treatment:    Area cleansed with:  Shur-Clens   Amount of cleaning:  Standard   Debridement:  None   Undermining:  None   Scar revision: no   Skin repair:    Repair method:  Sutures   Suture size:  4-0   Suture material:  Prolene   Suture technique:  Simple interrupted   Number of sutures:  2 Approximation:    Approximation:  Close Repair type:    Repair type:  Simple Post-procedure details:    Dressing:  Non-adherent dressing and antibiotic ointment   Procedure completion:  Tolerated well, no immediate complications  (including critical care time)  Medications Ordered in UC Medications - No data to display  Initial Impression / Assessment and Plan / UC Course  I have reviewed the triage vital signs and the nursing notes.  Pertinent labs & imaging results that were available during my care of the patient were reviewed by me and considered in my medical decision making (see chart for details).     1.  Right thumb laceration: Laceration repaired Daily wound dressing changes Suture removal in 7 to 10 days Tylenol as needed for pain Laceration repair education/instructions given to the patient. Final Clinical Impressions(s) / UC Diagnoses   Final diagnoses:  Laceration of right thumb without foreign body without damage to nail, initial encounter     Discharge Instructions      Daily wound dressing changes After 3 days okay to wash with mild soap and water Please avoid cooking or immersing your fingers in water over up for prolonged period of time Tylenol as needed for pain If you notice worsening pain, redness, discharge please return to urgent care to be reevaluated Come back to urgent care in 7 to 10 days for sutures removal    ED Prescriptions   None    PDMP not reviewed this encounter.   Chase Picket, MD 11/12/22 [id number]

## 2023-05-18 ENCOUNTER — Ambulatory Visit (INDEPENDENT_AMBULATORY_CARE_PROVIDER_SITE_OTHER): Payer: Medicare (Managed Care)

## 2023-05-18 ENCOUNTER — Other Ambulatory Visit: Payer: Self-pay

## 2023-05-18 ENCOUNTER — Ambulatory Visit
Admission: EM | Admit: 2023-05-18 | Discharge: 2023-05-18 | Disposition: A | Discharge status: Home / Self Care | Payer: Medicare (Managed Care) | Attending: Internal Medicine | Admitting: Internal Medicine

## 2023-05-18 DIAGNOSIS — M7750 Other enthesopathy of unspecified foot: Secondary | ICD-10-CM | POA: Diagnosis not present

## 2023-05-18 DIAGNOSIS — M7751 Other enthesopathy of right foot: Secondary | ICD-10-CM | POA: Diagnosis not present

## 2023-05-18 DIAGNOSIS — M79671 Pain in right foot: Secondary | ICD-10-CM

## 2023-05-18 NOTE — ED Triage Notes (Signed)
Pt presents to UC w/ c/o right foot pain x1 week. Pt reports working on her feet a lot and has tried different shoes without relief. Pain is located on the top and lateral aspect of foot. Hx plantar fascitis on left foot. Pt has tried excedrin and meloxicam without relief.

## 2023-05-18 NOTE — ED Provider Notes (Signed)
[facility]    CSN: [id number] Arrival date & time: 05/18/23  1156      History   Chief Complaint Chief Complaint  Patient presents with   Foot Pain    HPI Stacy [name] is a 67 y.o. female presents for evaluation of foot pain.  Patient reports a persistent right distal lateral foot pain that is worse with weightbearing and activity.  Denies any known injury or inciting event.  No numbness or tingling to the area.  States her foot feels swollen and she feels like the lateral aspect of the foot looked deformed.  Denies any pain to the sole of the foot or proximal foot.  No history of diabetes.  Has a history of plantar fasciitis to the left.  She has been taking Excedrin with minimal improvement.  No other concerns at this time.  Foot Pain    Past Medical History:  Diagnosis Date   Acquired hypothyroidism 04/24/2012   Anxiety    Blood transfusion without reported diagnosis    Depression with anxiety 04/24/2012   H/O chronic ulcerative colitis 04/24/2012   Mild    Radiation pneumonitis ([facility]) 04/24/2012   Serous cystadenoma of ovary 04/24/2012    Right salpingo-oophorectomy October, 2006   Small cell lung cancer ([facility]) 04/24/2012   Presented 01/1999 w right supraclavicular lymph node enlargement. Rx Carboplatinum/etoposide X 6, Topotecan X 2, Radiation   Vertigo     Patient Active Problem List   Diagnosis Date Noted   Gastroesophageal reflux disease without esophagitis 11/09/2018   Chronic low back pain 10/28/2015   Benign paroxysmal positional vertigo 10/28/2015   Chronic fatigue 03/30/2013   History of lung cancer 03/30/2013   Small cell lung cancer ([facility]) 04/24/2012   Acquired hypothyroidism 04/24/2012   Serous cystadenoma of ovary 04/24/2012   H/O chronic ulcerative colitis 04/24/2012   Depression with anxiety 04/24/2012    Past Surgical History:  Procedure Laterality Date   ABDOMINAL HYSTERECTOMY     laproscopy      OB History   No obstetric history on  file.      Home Medications    Prior to Admission medications   Medication Sig Start Date End Date Taking? Authorizing Provider  alendronate (FOSAMAX) 70 MG tablet Take by mouth. 06/21/22   [provider]  aspirin EC 81 MG tablet Take 81 mg by mouth daily.     [provider]  Cetirizine HCl 10 MG CAPS Take 1 capsule (10 mg total) by mouth daily for 10 days. 03/21/19 03/31/19  Wieters, Hallie C, PA-C  ibuprofen (ADVIL) 600 MG tablet Take 1 tablet (600 mg total) by mouth every 8 (eight) hours as needed. 03/06/19   Claiborne Rigg, NP  lactobacillus acidophilus (BACID) TABS tablet Take 2 tablets by mouth 3 (three) times daily.    [provider]  Multiple Vitamin (MULTIVITAMIN WITH MINERALS) TABS Take 1 tablet by mouth daily.    [provider]  Omega-3 Fatty Acids (FISH OIL) 1000 MG CAPS Take 1 capsule by mouth daily.     [provider]  OVER THE COUNTER MEDICATION VITAMIN D DAILY    [provider]  tiZANidine (ZANAFLEX) 4 MG tablet Take 1 tablet (4 mg total) by mouth at bedtime. 05/24/19   Kirsteins, Victorino Sparrow, MD  triamcinolone cream (KENALOG) 0.1 % Apply 1 application topically 2 (two) times daily. 03/21/19   Wieters, Hallie C, PA-C  DULoxetine (CYMBALTA) 30 MG capsule Take 1 capsule (30 mg total)  by mouth daily for 30 days. Patient not taking: Reported on 02/27/2019 02/13/19 03/21/19  Claiborne Rigg, NP    Family History Family History  Problem Relation Age of Onset   Hypertension Mother    High Cholesterol Mother    Prostate cancer Father    Colon cancer Neg Hx    Esophageal cancer Neg Hx    Rectal cancer Neg Hx    Stomach cancer Neg Hx     Social History Social History   Tobacco Use   Smoking status: Former    Current packs/day: 0.00    Types: Cigarettes    Quit date: 09/18/1999    Years since quitting: 23.6   Smokeless tobacco: Never  Vaping Use   Vaping status: Never Used  Substance Use Topics   Alcohol use: Not  Currently    Alcohol/week: 0.0 standard drinks of alcohol    Comment: Ocasional    Drug use: No     Allergies   Patient has no known allergies.   Review of Systems Review of Systems  Musculoskeletal:        Right foot pain      Physical Exam Triage Vital Signs ED Triage Vitals  Encounter Vitals Group     BP 05/18/23 1257 116/79     Systolic BP Percentile --      Diastolic BP Percentile --      Pulse Rate 05/18/23 1257 94     Resp 05/18/23 1257 18     Temp 05/18/23 1257 98.8 F (37.1 C)     Temp Source 05/18/23 1257 Oral     SpO2 05/18/23 1257 94 %     Weight --      Height --      Head Circumference --      Peak Flow --      Pain Score 05/18/23 1302 7     Pain Loc --      Pain Education --      Exclude from Growth Chart --    No data found.  Updated Vital Signs BP 116/79 (BP Location: Right Arm)   Pulse 94   Temp 98.8 F (37.1 C) (Oral)   Resp 18   SpO2 94%   Visual Acuity Right Eye Distance:   Left Eye Distance:   Bilateral Distance:    Right Eye Near:   Left Eye Near:    Bilateral Near:     Physical Exam Vitals and nursing note reviewed.  Constitutional:      General: She is not in acute distress.    Appearance: Normal appearance. She is not ill-appearing.  HENT:     Head: Normocephalic and atraumatic.  Eyes:     Pupils: Pupils are equal, round, and reactive to light.  Cardiovascular:     Rate and Rhythm: Normal rate.  Pulmonary:     Effort: Pulmonary effort is normal.  Musculoskeletal:       Feet:  Feet:     Comments: There is no obvious swelling of the right foot.  No ecchymosis or erythema.  Moderately tender to palpation to the distal fourth and fifth metatarsals.  No tenderness with palpation to plantar fascia.  Full range of motion of foot with some pain on flexion.  DP +2.  Cap refill +2. Skin:    General: Skin is warm and dry.  Neurological:     General: No focal deficit present.     Mental Status: She is alert and oriented to  person, place, and time.  Psychiatric:        Mood and Affect: Mood normal.        Behavior: Behavior normal.      UC Treatments / Results  Labs (all labs ordered are listed, but only abnormal results are displayed) Labs Reviewed - No data to display  EKG   Radiology No results found.  Procedures Procedures (including critical care time)  Medications Ordered in UC Medications - No data to display  Initial Impression / Assessment and Plan / UC Course  I have reviewed the triage vital signs and the nursing notes.  Pertinent labs & imaging results that were available during my care of the patient were reviewed by me and considered in my medical decision making (see chart for details).     Reviewed exam and symptoms with patient.  No red flags.  Discussed likely tendinitis.  Patient prefers to do over-the-counter NSAIDs which she can take as needed.  RICE therapy advised.  She should follow-up with her PCP or podiatrist if symptoms do not improve.  ER precautions reviewed and patient verbalized understanding Final Clinical Impressions(s) / UC Diagnoses   Final diagnoses:  Right foot pain  Tendonitis of foot     Discharge Instructions      You may do over-the-counter ibuprofen every 6-8 hours as needed.  Rest, elevate and ice the foot as needed.  Please follow-up with your PCP or podiatrist if symptoms do not improve.  Please go the emergency room for any worsening symptoms.  I hope you feel better soon!     ED Prescriptions   None    PDMP not reviewed this encounter.   Radford Pax, NP 05/18/23 [phone]

## 2023-05-18 NOTE — Discharge Instructions (Signed)
You may do over-the-counter ibuprofen every 6-8 hours as needed.  Rest, elevate and ice the foot as needed.  Please follow-up with your PCP or podiatrist if symptoms do not improve.  Please go the emergency room for any worsening symptoms.  I hope you feel better soon!

## 2024-06-28 ENCOUNTER — Ambulatory Visit (INDEPENDENT_AMBULATORY_CARE_PROVIDER_SITE_OTHER)

## 2024-06-28 ENCOUNTER — Ambulatory Visit: Payer: Self-pay | Admitting: Nurse Practitioner

## 2024-06-28 ENCOUNTER — Ambulatory Visit
Admission: EM | Admit: 2024-06-28 | Discharge: 2024-06-28 | Disposition: A | Discharge status: Home / Self Care | Source: Ambulatory Visit | Attending: Family Medicine | Admitting: Family Medicine

## 2024-06-28 DIAGNOSIS — R0789 Other chest pain: Secondary | ICD-10-CM

## 2024-06-28 DIAGNOSIS — S2241XA Multiple fractures of ribs, right side, initial encounter for closed fracture: Secondary | ICD-10-CM | POA: Diagnosis not present

## 2024-06-28 MED ORDER — HYDROCODONE-IBUPROFEN 5-200 MG PO TABS: 1.0000 | ORAL_TABLET | Freq: Three times a day (TID) | ORAL | 0 refills | Status: AC | PRN

## 2024-06-28 NOTE — ED Provider Notes (Signed)
 [facility]    CSN: [id number] Arrival date & time: 06/28/24  1400      History   Chief Complaint Chief Complaint  Patient presents with   Fall    HPI Stacy [name] is a 68 y.o. female presents for rib pain.  Patient reports 3 days ago she fell in her yard landing onto her right side.  No head injury or LOC.  States since then she has been having right mid lateral to anterior rib pain.  Denies any bruising or swelling.  No hemoptysis.  No shortness of breath but does have pain with deep inspiration.  Does have a history of rib fractures on that side in the past.  She has been taking meloxicam , ibuprofen , Tylenol  without improvement.  No other concerns at this time.   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 Rx Carboplatinum/etoposide X 6, Topotecan X 2, Radiation   Vertigo     Patient Active Problem List   Diagnosis Date Noted   Gastroesophageal reflux disease without esophagitis 11/09/2018   Chronic low back pain 10/28/2015   Benign paroxysmal positional vertigo 10/28/2015   Chronic fatigue 03/30/2013   History of lung cancer 03/30/2013   Small cell lung cancer (HCC) 04/24/2012   Acquired hypothyroidism 04/24/2012   Serous cystadenoma of ovary 04/24/2012   H/O chronic ulcerative colitis 04/24/2012   Depression with anxiety 04/24/2012    Past Surgical History:  Procedure Laterality Date   ABDOMINAL HYSTERECTOMY     laproscopy      OB History   No obstetric history on file.      Home Medications    Prior to Admission medications    Medication Sig Start Date End Date Taking? Authorizing Provider  hydrocodone -ibuprofen  (VICOPROFEN ) 5-200 MG tablet Take 1 tablet by mouth every 8 (eight) hours as needed for pain. 06/28/24  Yes Chevon Fomby, Jodi R, NP  alendronate (FOSAMAX) 70 MG tablet Take by mouth. 06/21/22   [provider]  aspirin EC 81 MG tablet Take 81 mg by mouth daily.     [provider]  Cetirizine  HCl 10 MG CAPS Take 1 capsule (10 mg total) by mouth daily for 10 days. 03/21/19 03/31/19  Wieters, Hallie C, PA-C  lactobacillus acidophilus (BACID) TABS tablet Take 2 tablets by mouth 3 (three) times daily.    [provider]  Multiple Vitamin (MULTIVITAMIN WITH MINERALS) TABS Take 1 tablet by mouth daily.    [provider]  Omega-3 Fatty Acids (FISH OIL) 1000 MG CAPS Take 1 capsule by mouth daily.     [provider]  OVER THE COUNTER MEDICATION VITAMIN D  DAILY    [provider]  tiZANidine  (ZANAFLEX ) 4 MG tablet Take 1 tablet (4 mg total) by mouth at bedtime. 05/24/19   Kirsteins, Prentice BRAVO, MD  triamcinolone  cream (KENALOG ) 0.1 % Apply 1 application topically 2 (two) times daily. 03/21/19   Wieters, Hallie C, PA-C  DULoxetine  (CYMBALTA ) 30 MG capsule Take 1 capsule (30 mg total) by mouth daily for 30 days. Patient not taking: Reported on  02/27/2019 02/13/19 03/21/19  Theotis Haze ORN, NP    Family History Family History  Problem Relation Age of Onset   Hypertension Mother    High Cholesterol Mother    Prostate cancer Father    Colon cancer Neg Hx    Esophageal cancer Neg Hx    Rectal cancer Neg Hx    Stomach cancer Neg Hx     Social History Social History   Tobacco Use   Smoking status: Former    Current packs/day: 0.00    Types: Cigarettes    Quit date: 09/18/1999    Years since quitting: 24.7   Smokeless tobacco: Never  Vaping Use   Vaping status: Never Used  Substance Use Topics   Alcohol use: Not Currently    Alcohol/week: 0.0 standard drinks of alcohol     Comment: Ocasional    Drug use: No     Allergies   Patient has no known allergies.   Review of Systems Review of Systems  Musculoskeletal:        Right rib pain after fall     Physical Exam Triage Vital Signs ED Triage Vitals  Encounter Vitals Group     BP 06/28/24 1411 123/85     Girls Systolic BP Percentile --      Girls Diastolic BP Percentile --      Boys Systolic BP Percentile --      Boys Diastolic BP Percentile --      Pulse Rate 06/28/24 1411 95     Resp 06/28/24 1411 20     Temp 06/28/24 1411 98.1 F (36.7 C)     Temp src --      SpO2 06/28/24 1411 96 %     Weight --      Height --      Head Circumference --      Peak Flow --      Pain Score 06/28/24 1410 10     Pain Loc --      Pain Education --      Exclude from Growth Chart --    No data found.  Updated Vital Signs BP 123/85   Pulse 95   Temp 98.1 F (36.7 C)   Resp 20   SpO2 96%   Visual Acuity Right Eye Distance:   Left Eye Distance:   Bilateral Distance:    Right Eye Near:   Left Eye Near:    Bilateral Near:     Physical Exam Vitals and nursing note reviewed.  Constitutional:      General: She is not in acute distress.    Appearance: Normal appearance. She is not ill-appearing.  HENT:     Head: Normocephalic and atraumatic.  Eyes:     Pupils: Pupils are equal, round, and reactive to light.  Cardiovascular:     Rate and Rhythm: Normal rate.  Pulmonary:     Effort: Pulmonary effort is normal.  Musculoskeletal:       Arms:     Comments: There is no swelling, ecchymosis of the right ribs.  No crepitus or deformity.  Tender to palpation to the mid lateral ribs that extends to the lower anterior ribs.  Equal breath sounds bilaterally  Skin:    General: Skin is warm and dry.  Neurological:     General: No focal deficit present.     Mental Status: She is alert and oriented to person, place, and time.  Psychiatric:        Mood  and Affect: Mood normal.        Behavior: Behavior  normal.      UC Treatments / Results  Labs (all labs ordered are listed, but only abnormal results are displayed) Labs Reviewed - No data to display  Comprehensive Metabolic Panel Order: [id number] Component Ref Range & Units 7 mo ago  Sodium 136 - 145 mmol/L 139  Potassium 3.5 - 5.1 mmol/L 4.0  Comment: NO VISIBLE HEMOLYSIS  Chloride 98 - 107 mmol/L 103  CO2 21 - 31 mmol/L 27  Anion Gap 6 - 14 mmol/L 9  Glucose, Random 70 - 99 mg/dL 88  Blood Urea Nitrogen (BUN) 7 - 25 mg/dL 16  Creatinine 9.39 - 8.79 mg/dL 9.41 Low   eGFR >40 fO/fpw/8.26f7 >90  Comment: GFR estimated by CKD-EPI equations(NKF 2021).  Recommend confirmation of Cr-based eGFR by using Cys-based eGFR and other filtration markers (if applicable) in complex cases and clinical decision-making, as needed.  Albumin 3.5 - 5.7 g/dL 4.4  Total Protein 6.4 - 8.9 g/dL 8.2  Bilirubin, Total 0.3 - 1.0 mg/dL 0.4  Alkaline Phosphatase (ALP) 34 - 104 U/L 52  Aspartate Aminotransferase (AST) 13 - 39 U/L 17  Alanine Aminotransferase (ALT) 7 - 52 U/L 14  Calcium  8.6 - 10.3 mg/dL 9.1  BUN/Creatinine Ratio 10.0 - 20.0 27.6 High   Comment: Potential prerenal damage (e.g. CHF, GI Bleeding)  Resulting Agency [facility](CLIA# [id number])   Specimen Collected: 11/29/23 09:56   Performed by: [facility](CLIA# [id number]) Last Resulted: 11/29/23 14:40  Received From: [facility]  Result Received: 06/28/24 14:00    EKG   Radiology DG Ribs Unilateral W/Chest Right Result Date: 06/28/2024 CLINICAL DATA:  Clemens on right ribs 3 days ago. Right rib pain and shortness of breath. EXAM: RIGHT RIBS AND CHEST - 3+ VIEW COMPARISON:  Chest radiograph 09/14/2022.  CT chest 12/14/2023 FINDINGS: Shallow inspiration with elevation of the left hemidiaphragm. Heart size and pulmonary vascularity are normal. Surgical clips in the right apex. Emphysematous changes in the lungs.  Scarring and volume loss in the right lung is decreased since prior study and also present on prior chest CT. No developing mass or consolidation. No pleural effusion or pneumothorax. Right rib detail views demonstrate focal irregularity in the right anterior fifth and sixth ribs suggesting nondisplaced fractures. No focal bone lesion or bone destruction. Soft tissues are unremarkable. IMPRESSION: 1. No evidence of active pulmonary disease. Known scarring and volume loss in the medial right lung is decreased since previous study. 2. Emphysematous changes in the lungs. 3. Acute appearing nondisplaced fractures of right anterior fifth and sixth ribs. Electronically Signed   By: Elsie Gravely M.D.   On: 06/28/2024 15:31    Procedures Procedures (including critical care time)  Medications Ordered in UC Medications - No data to display  Initial Impression / Assessment and Plan / UC Course  I have reviewed the triage vital signs and the nursing notes.  Pertinent labs & imaging results that were available during my care of the patient were reviewed by me and considered in my medical decision making (see chart for details).     Reviewed exam and symptoms with patient.  X-ray shows right 5th and 6th nondisplaced rib fractures.  Discussed splinting.  Will do Vicoprofen  as patient states this has worked better for her in the past then hydrocodone  and Tylenol .  PDMP reviewed.  Advised not to take additional ibuprofen  while on this  medication.  Advised PCP follow-up 2 to 3 days for recheck.  Strict ER precautions reviewed and patient verbalized understanding Final Clinical Impressions(s) / UC Diagnoses   Final diagnoses:  Rib pain on right side  Closed fracture of multiple ribs of right side, initial encounter     Discharge Instructions      Your x-ray shows that you have broken your right 5th and 6th ribs.  You may take Vicoprofen  every 8 hours as needed for pain.  This contains ibuprofen , do not  take additional ibuprofen  over-the-counter while taking this.  You can take Tylenol  if needed.  Use a pillow or place your hands over your ribs to splint the area if you need to cough sneeze.  Please monitor your symptoms closely and if you develop any coughing up blood, shortness of breath, uncontrolled pain please go to the emergency room as soon as possible for further evaluation.  Follow-up with your PCP in 2 to 3 days for recheck.  I hope you feel better soon!     ED Prescriptions     Medication Sig Dispense Auth. Provider   hydrocodone -ibuprofen  (VICOPROFEN ) 5-200 MG tablet Take 1 tablet by mouth every 8 (eight) hours as needed for pain. 12 tablet Danis Pembleton, Jodi R, NP      I have reviewed the PDMP during this encounter.   Loreda Myla SAUNDERS, NP 06/28/24 1544

## 2024-06-28 NOTE — Discharge Instructions (Addendum)
 Your x-ray shows that you have broken your right 5th and 6th ribs.  You may take Vicoprofen  every 8 hours as needed for pain.  This contains ibuprofen , do not take additional ibuprofen  over-the-counter while taking this.  You can take Tylenol  if needed.  Use a pillow or place your hands over your ribs to splint the area if you need to cough sneeze.  Please monitor your symptoms closely and if you develop any coughing up blood, shortness of breath, uncontrolled pain please go to the emergency room as soon as possible for further evaluation.  Follow-up with your PCP in 2 to 3 days for recheck.  I hope you feel better soon!

## 2024-06-28 NOTE — ED Triage Notes (Signed)
 Pt present with c/o rib pain. Pt states she has had trouble breathing after the fall. Pt reports she fell Monday night when getting out of her car. States she fell on something and states she does not know if she broke a rib.

## 2024-07-02 NOTE — Telephone Encounter (Signed)
 Pt informed that letter has been uploaded, she expressed understanding

## 2024-08-07 ENCOUNTER — Ambulatory Visit (INDEPENDENT_AMBULATORY_CARE_PROVIDER_SITE_OTHER)

## 2024-08-07 ENCOUNTER — Ambulatory Visit: Payer: Self-pay | Admitting: Urgent Care

## 2024-08-07 ENCOUNTER — Ambulatory Visit
Admission: EM | Admit: 2024-08-07 | Discharge: 2024-08-07 | Disposition: A | Discharge status: Home / Self Care | Attending: Urgent Care | Admitting: Urgent Care

## 2024-08-07 DIAGNOSIS — R52 Pain, unspecified: Secondary | ICD-10-CM

## 2024-08-07 DIAGNOSIS — R0602 Shortness of breath: Secondary | ICD-10-CM

## 2024-08-07 DIAGNOSIS — J209 Acute bronchitis, unspecified: Secondary | ICD-10-CM

## 2024-08-07 LAB — POCT INFLUENZA A/B
Influenza A, POC: NEGATIVE
Influenza B, POC: NEGATIVE

## 2024-08-07 MED ORDER — PREDNISONE 20 MG PO TABS: ORAL_TABLET | ORAL | 0 refills | Status: AC

## 2024-08-07 MED ORDER — PROMETHAZINE-DM 6.25-15 MG/5ML PO SYRP: 5.0000 mL | ORAL_SOLUTION | Freq: Three times a day (TID) | ORAL | 0 refills | Status: AC | PRN

## 2024-08-07 MED ORDER — ALBUTEROL SULFATE HFA 108 (90 BASE) MCG/ACT IN AERS: 1.0000 | INHALATION_SPRAY | Freq: Four times a day (QID) | RESPIRATORY_TRACT | 0 refills | Status: AC | PRN

## 2024-08-07 NOTE — ED Provider Notes (Signed)
 [name]  Note:  This document was prepared using Conservation officer, historic buildings and may include unintentional dictation errors.  MRN: [id number] DOB: [birth year]  Subjective:   Stacy [name] is a 68 y.o. female presenting for 3-day history of body pains, coughing, wheezing, shortness of breath, sinus congestion.  Would like a flu test.  Does not want a COVID test.  Patient quit smoking 25 years ago.  Has a remote history of small cell lung cancer and has been in remission.    No current facility-administered medications for this encounter.  Current Outpatient Medications:    alendronate (FOSAMAX) 70 MG tablet, Take by mouth., Disp: , Rfl:    aspirin EC 81 MG tablet, Take 81 mg by mouth daily. , Disp: , Rfl:    Cetirizine  HCl 10 MG CAPS, Take 1 capsule (10 mg total) by mouth daily for 10 days., Disp: 10 capsule, Rfl: 0   hydrocodone -ibuprofen  (VICOPROFEN ) 5-200 MG tablet, Take 1 tablet by mouth every 8 (eight) hours as needed for pain., Disp: 12 tablet, Rfl: 0   lactobacillus acidophilus (BACID) TABS tablet, Take 2 tablets by mouth 3 (three) times daily., Disp: , Rfl:    Multiple Vitamin (MULTIVITAMIN WITH MINERALS) TABS, Take 1 tablet by mouth daily., Disp: , Rfl:    Omega-3 Fatty Acids (FISH OIL) 1000 MG CAPS, Take 1 capsule by mouth daily. 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 Rx Carboplatinum/etoposide X 6, Topotecan X 2, Radiation   Vertigo      Past Surgical History:  Procedure Laterality Date   ABDOMINAL HYSTERECTOMY     laproscopy      Family History  Problem Relation Age of Onset   Hypertension Mother    High Cholesterol Mother    Prostate cancer Father    Colon cancer Neg Hx    Esophageal cancer Neg Hx    Rectal cancer Neg Hx    Stomach cancer Neg Hx     Social History   Tobacco Use   Smoking status: Former    Current packs/day: 0.00    Types: Cigarettes    Quit date: 09/18/1999    Years since quitting: 24.9   Smokeless tobacco: Never  Vaping Use   Vaping status: Never Used  Substance Use Topics   Alcohol use: Not Currently    Alcohol/week: 0.0 standard drinks of alcohol    Comment: Ocasional    Drug use: No    ROS   Objective:   Vitals: BP 128/74 (BP Location: Right Arm)   Pulse (!) 103   Temp 99.5 F (37.5 C) (Oral)   Resp 18   SpO2 93%   Physical Exam Constitutional:  General: She is not in acute distress.    Appearance: Normal appearance. She is well-developed and normal weight. She is not ill-appearing, toxic-appearing or diaphoretic.  HENT:     Head: Normocephalic and atraumatic.     Right Ear: Tympanic membrane, ear canal and external ear normal. No drainage or tenderness. No middle ear effusion. There is no impacted cerumen. Tympanic membrane is not erythematous or bulging.     Left Ear: Tympanic membrane, ear canal and external ear normal. No drainage or tenderness.  No middle ear effusion. There is no impacted cerumen. Tympanic membrane is not erythematous or bulging.     Nose: Nose normal. No congestion or rhinorrhea.     Mouth/Throat:     Mouth: Mucous membranes are moist. No oral lesions.     Pharynx: No pharyngeal swelling, oropharyngeal exudate,  posterior oropharyngeal erythema or uvula swelling.     Tonsils: No tonsillar exudate or tonsillar abscesses.  Eyes:     General: No scleral icterus.       Right eye: No discharge.        Left eye: No discharge.     Extraocular Movements: Extraocular movements intact.     Right eye: Normal extraocular motion.     Left eye: Normal extraocular motion.     Conjunctiva/sclera: Conjunctivae normal.  Cardiovascular:     Rate and Rhythm: Normal rate and regular rhythm.     Heart sounds: Normal heart sounds. No murmur heard.    No friction rub. No gallop.  Pulmonary:     Effort: Pulmonary effort is normal. No respiratory distress.     Breath sounds: No stridor. Examination of the right-middle field reveals wheezing and rhonchi. Examination of the left-middle field reveals wheezing and rhonchi. Examination of the right-lower field reveals rhonchi. Examination of the left-lower field reveals rhonchi. Wheezing and rhonchi present. No decreased breath sounds or rales.  Chest:     Chest wall: No tenderness.  Musculoskeletal:     Cervical back: Normal range of motion and neck supple.  Lymphadenopathy:     Cervical: No cervical adenopathy.  Skin:    General: Skin is warm and dry.  Neurological:     General: No focal deficit present.     Mental Status: She is alert and oriented to person, place, and time.  Psychiatric:        Mood and Affect: Mood normal.        Behavior: Behavior normal.    Results for orders placed or performed during the hospital encounter of 08/07/24 (from the past 24 hours)  POCT Influenza A/B     Status: None   Collection Time: 08/07/24  2:24 PM  Result Value Ref Range   Influenza A, POC Negative Negative   Influenza B, POC Negative Negative   DG Chest 2 View Result Date: 08/07/2024 CLINICAL DATA:  Shortness of breath. EXAM: CHEST - 2 VIEW COMPARISON:  Chest CT dated 06/28/2024. FINDINGS: Background of emphysema. Right perihilar post treatment changes. No focal  consolidation, pleural effusion, or pneumothorax. Stable cardiac silhouette no acute osseous pathology. IMPRESSION: No active cardiopulmonary disease. Electronically Signed   By: Vanetta Chou M.D.   On: 08/07/2024 14:44   Patient refused breathing treatment in clinic.  Assessment and Plan :   PDMP not reviewed this encounter.  1. Acute bronchitis, unspecified organism   2. Shortness of breath   3. Body aches    Recommended regimen of prednisone , albuterol  for her bronchitis.  Use supportive care otherwise.  Counseled patient on potential for adverse effects with medications prescribed/recommended today, ER and return-to-clinic precautions discussed, patient verbalized understanding.    Christopher Savannah, [place] 08/07/24 [phone]

## 2024-08-07 NOTE — ED Triage Notes (Signed)
 Pt reports body aches, nasal congestion, cough, wheezing and shortness of breath x 2-3 days. Otc meds gives no relief.

## 2024-08-07 NOTE — Discharge Instructions (Addendum)
 Will update you with your chest x-ray results later today and update your treatment plan as necessary. Use prednisone  to help as a respiratory boost for your bronchitis. Use albuterol  for your shortness of breath and wheezing. Take cough syrup as needed.

## 2024-08-13 ENCOUNTER — Ambulatory Visit
Admission: EM | Admit: 2024-08-13 | Discharge: 2024-08-13 | Disposition: A | Discharge status: Home / Self Care | Attending: Nurse Practitioner | Admitting: Nurse Practitioner

## 2024-08-13 DIAGNOSIS — R051 Acute cough: Secondary | ICD-10-CM

## 2024-08-13 DIAGNOSIS — J209 Acute bronchitis, unspecified: Secondary | ICD-10-CM

## 2024-08-13 MED ORDER — AMOXICILLIN-POT CLAVULANATE 875-125 MG PO TABS: 1.0000 | ORAL_TABLET | Freq: Two times a day (BID) | ORAL | 0 refills | Status: AC

## 2024-08-13 MED ORDER — BENZONATATE 200 MG PO CAPS: 200.0000 mg | ORAL_CAPSULE | Freq: Three times a day (TID) | ORAL | 0 refills | Status: AC | PRN

## 2024-08-13 NOTE — Discharge Instructions (Signed)
 Due to the duration of your symptoms, we will start you on an antibiotic today. I have also prescribed a cough capsule called Tessalon  to help provide you some relief May use your Albuterol  inhaler every 4 hours for the next 2-3 days while awake Ensure adequate oral hydration Coricidin HBP cough/cold is a safe alternative to treat your symptoms without abnormally elevating your blood pressure

## 2024-08-13 NOTE — ED Triage Notes (Addendum)
 Pt present with c/o cough and wheezing X 1 week. Pt states she has trouble breathing and has used the albuterol  at home, has had some relief. States she is only requesting an alternative due to the cough syrup not working.

## 2024-08-13 NOTE — ED Provider Notes (Signed)
 [facility]    CSN: [id number] Arrival date & time: 08/13/24  1303      History   Chief Complaint Chief Complaint  Patient presents with   Cough    HPI Stacy [name] is a 68 y.o. female.   Patient presents requesting reevaluation for continued cough, wheezing, shortness of breath, and generally feeling unwell.  States she has had this cough for approximately 2 weeks now.  Was evaluated here on 08/07/2024 and diagnosed with acute bronchitis.  A chest x-ray did not demonstrate any focal consolidations concerning for pneumonia.  She was provided an albuterol  inhaler, prednisone , and a cough syrup (which she states has been ineffective).  No reported fever, chest pain, abdominal pain, vomiting, or diarrhea.  She has some mild rhinorrhea.  The cough is productive with a green sputum.  Has seemed to intensify over the past 5 days.  The history is provided by the patient.  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 Rx Carboplatinum/etoposide X 6, Topotecan X 2, Radiation   Vertigo     Patient Active Problem List   Diagnosis Date Noted   Gastroesophageal reflux disease without esophagitis 11/09/2018   Chronic low back pain 10/28/2015   Benign paroxysmal positional vertigo 10/28/2015   Chronic fatigue 03/30/2013   History of lung cancer 03/30/2013   Small cell lung cancer (HCC) 04/24/2012   Acquired  hypothyroidism 04/24/2012   Serous cystadenoma of ovary 04/24/2012   H/O chronic ulcerative colitis 04/24/2012   Depression with anxiety 04/24/2012    Past Surgical History:  Procedure Laterality Date   ABDOMINAL HYSTERECTOMY     laproscopy      OB History   No obstetric history on file.      Home Medications    Prior to Admission medications   Medication Sig Start Date End Date Taking? Authorizing Provider  amoxicillin -clavulanate (AUGMENTIN ) 875-125 MG tablet Take 1 tablet by mouth every 12 (twelve) hours. 08/13/24  Yes Janet Therisa PARAS, FNP  benzonatate  (TESSALON ) 200 MG capsule Take 1 capsule (200 mg total) by mouth 3 (three) times daily as needed for cough. 08/13/24  Yes Janet Therisa PARAS, FNP  albuterol  (VENTOLIN  HFA) 108 [phone] Base) MCG/ACT inhaler Inhale 1-2 puffs into the lungs every 6 (six) hours as needed for wheezing or shortness of breath. 08/07/24   Christopher Savannah, PA-C  alendronate (FOSAMAX) 70 MG tablet Take by mouth. 06/21/22   [provider]  aspirin EC 81 MG tablet Take 81 mg by mouth daily.     [provider]  Cetirizine  HCl 10 MG CAPS Take 1 capsule (10 mg total) by mouth daily for 10 days. 03/21/19 03/31/19  Wieters, Hallie C, PA-C  hydrocodone -ibuprofen  (VICOPROFEN ) 5-200 MG tablet Take 1 tablet by mouth every 8 (eight) hours as needed for pain. 06/28/24   Mayer, Jodi R, NP  lactobacillus acidophilus (BACID) TABS tablet Take 2 tablets by mouth  3 (three) times daily.    [provider]  Multiple Vitamin (MULTIVITAMIN WITH MINERALS) TABS Take 1 tablet by mouth daily.    [provider]  Omega-3 Fatty Acids (FISH OIL) 1000 MG CAPS Take 1 capsule by mouth daily.     [provider]  OVER THE COUNTER MEDICATION VITAMIN D  DAILY    [provider]  predniSONE  (DELTASONE ) 20 MG tablet Take 2 tablets daily with breakfast. 08/07/24   Christopher Savannah, PA-C  promethazine -dextromethorphan (PROMETHAZINE -DM) 6.25-15 MG/5ML syrup Take 5 mLs  by mouth 3 (three) times daily as needed for cough. 08/07/24   Christopher Savannah, PA-C  tiZANidine  (ZANAFLEX ) 4 MG tablet Take 1 tablet (4 mg total) by mouth at bedtime. 05/24/19   Kirsteins, Prentice BRAVO, MD  triamcinolone  cream (KENALOG ) 0.1 % Apply 1 application topically 2 (two) times daily. 03/21/19   Wieters, Hallie C, PA-C  DULoxetine  (CYMBALTA ) 30 MG capsule Take 1 capsule (30 mg total) by mouth daily for 30 days. Patient not taking: Reported on 02/27/2019 02/13/19 03/21/19  Theotis Haze ORN, NP    Family History Family History  Problem Relation Age of Onset   Hypertension Mother    High Cholesterol Mother    Prostate cancer Father    Colon cancer Neg Hx    Esophageal cancer Neg Hx    Rectal cancer Neg Hx    Stomach cancer Neg Hx     Social History Social History   Tobacco Use   Smoking status: Former    Current packs/day: 0.00    Types: Cigarettes    Quit date: 09/18/1999    Years since quitting: 24.9   Smokeless tobacco: Never  Vaping Use   Vaping status: Never Used  Substance Use Topics   Alcohol use: Not Currently    Alcohol/week: 0.0 standard drinks of alcohol    Comment: Ocasional    Drug use: No     Allergies   Patient has no known allergies.   Review of Systems Review of Systems  Constitutional:  Negative for chills and fever.  HENT:  Positive for rhinorrhea. Negative for congestion and sore throat.   Eyes:  Negative for pain and redness.  Respiratory:  Positive for cough and shortness of breath.   Cardiovascular:  Negative for chest pain.  Gastrointestinal:  Negative for abdominal pain, diarrhea and vomiting.  Genitourinary:  Negative for dysuria and urgency.  Musculoskeletal:  Negative for back pain and myalgias.  Neurological:  Negative for dizziness and headaches.     Physical Exam Triage Vital Signs ED Triage Vitals  Encounter Vitals Group     BP 08/13/24 1429 132/78     Girls Systolic BP Percentile --      Girls Diastolic BP Percentile --      Boys  Systolic BP Percentile --      Boys Diastolic BP Percentile --      Pulse Rate 08/13/24 1429 86     Resp 08/13/24 1429 15     Temp 08/13/24 1429 97.9 F (36.6 C)     Temp src --      SpO2 08/13/24 1429 96 %     Weight --      Height --      Head Circumference --      Peak Flow --      Pain Score 08/13/24 1427 0     Pain Loc --      Pain Education --      Exclude from [employer]  Chart --    No data found.  Updated Vital Signs BP 132/78 (BP Location: Right Arm)   Pulse 86   Temp 97.9 F (36.6 C)   Resp 15   SpO2 96%   Physical Exam Vitals and nursing note reviewed.  Constitutional:      Appearance: Normal appearance.  HENT:     Head: Normocephalic.     Right Ear: Tympanic membrane and ear canal normal.     Left Ear: Tympanic membrane and ear canal normal.     Nose: Congestion present.     Mouth/Throat:     Mouth: Mucous membranes are moist.     Pharynx: No posterior oropharyngeal erythema.  Eyes:     Conjunctiva/sclera: Conjunctivae normal.     Pupils: Pupils are equal, round, and reactive to light.  Cardiovascular:     Rate and Rhythm: Normal rate and regular rhythm.     Heart sounds: Normal heart sounds.  Pulmonary:     Breath sounds: Wheezing (Right lower lung that clears with cough) present. No rhonchi or rales.     Comments: Congested cough heard on exam Abdominal:     General: Bowel sounds are normal.  Skin:    General: Skin is warm and dry.  Neurological:     General: No focal deficit present.     Mental Status: She is alert and oriented to person, place, and time.  Psychiatric:        Mood and Affect: Mood normal.        Behavior: Behavior normal.        Thought Content: Thought content normal.        Judgment: Judgment normal.      UC Treatments / Results  Labs (all labs ordered are listed, but only abnormal results are displayed) Labs Reviewed - No data to display  EKG   Radiology No results found.  Procedures Procedures (including  critical care time)  Medications Ordered in UC Medications - No data to display  Initial Impression / Assessment and Plan / UC Course  I have reviewed the triage vital signs and the nursing notes.  Pertinent labs & imaging results that were available during my care of the patient were reviewed by me and considered in my medical decision making (see chart for details).    Patient presents for reevaluation of a cough, chest congestion, shortness of breath, and feeling unwell.  Was treated with albuterol , prednisone , and Promethazine  DM cough syrup on 08/07/2024 without any significant improvement.  Based upon duration of symptoms, reasonable to provide antibiotic therapy at this time.  No indication to repeat the chest x-ray at today's visit.  I have also prescribed Tessalon  Perles for her to use to see if she gets some symptomatic relief.  Would also recommend Coricidin HBP cough and cold.  Ensure adequate rest and oral hydration.  Return for reevaluation if symptoms persist or worsen on this therapy.  A work note was provided.  Final Clinical Impressions(s) / UC Diagnoses   Final diagnoses:  Acute bronchitis, unspecified organism  Acute cough     Discharge Instructions      Due to the duration of your symptoms, we will start you on an antibiotic today. I have also prescribed a cough capsule called Tessalon  to help provide you some relief May use your Albuterol  inhaler every 4 hours for the next 2-3 days while awake Ensure adequate oral hydration Coricidin HBP cough/cold is a safe alternative to treat your symptoms without  abnormally elevating your blood pressure     ED Prescriptions     Medication Sig Dispense Auth. Provider   amoxicillin -clavulanate (AUGMENTIN ) 875-125 MG tablet Take 1 tablet by mouth every 12 (twelve) hours. 14 tablet Janet Therisa PARAS, FNP   benzonatate  (TESSALON ) 200 MG capsule Take 1 capsule (200 mg total) by mouth 3 (three) times daily as needed for cough. 20  capsule Janet Therisa PARAS, FNP      PDMP not reviewed this encounter.   Janet Therisa PARAS, FNP 08/13/24 [phone]
